# Patient Record
Sex: Female | Born: 1990 | Race: White | Hispanic: No | Marital: Married | Smoking: Never smoker
Health system: Southern US, Community
[De-identification: ages and names within clinical notes are randomized; demographics above are authoritative.]

## PROBLEM LIST (undated history)

## (undated) ENCOUNTER — Inpatient Hospital Stay: Payer: Self-pay

## (undated) DIAGNOSIS — F32A Depression, unspecified: Secondary | ICD-10-CM

## (undated) DIAGNOSIS — R87629 Unspecified abnormal cytological findings in specimens from vagina: Secondary | ICD-10-CM

## (undated) DIAGNOSIS — F329 Major depressive disorder, single episode, unspecified: Secondary | ICD-10-CM

## (undated) DIAGNOSIS — Z62810 Personal history of physical and sexual abuse in childhood: Secondary | ICD-10-CM

## (undated) DIAGNOSIS — Z789 Other specified health status: Secondary | ICD-10-CM

## (undated) HISTORY — DX: Personal history of physical and sexual abuse in childhood: Z62.810

## (undated) HISTORY — DX: Depression, unspecified: F32.A

## (undated) HISTORY — PX: OTHER SURGICAL HISTORY: SHX169

## (undated) HISTORY — DX: Unspecified abnormal cytological findings in specimens from vagina: R87.629

## (undated) HISTORY — PX: NO PAST SURGERIES: SHX2092

---
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
## 2015-03-17 ENCOUNTER — Encounter: Payer: Self-pay | Admitting: Emergency Medicine

## 2015-03-17 ENCOUNTER — Emergency Department
Admission: EM | Admit: 2015-03-17 | Discharge: 2015-03-17 | Disposition: A | Discharge status: Home / Self Care | Payer: Self-pay | Attending: Emergency Medicine | Admitting: Emergency Medicine

## 2015-03-17 ENCOUNTER — Emergency Department: Payer: Self-pay

## 2015-03-17 DIAGNOSIS — Z3A01 Less than 8 weeks gestation of pregnancy: Secondary | ICD-10-CM | POA: Insufficient documentation

## 2015-03-17 DIAGNOSIS — O418X10 Other specified disorders of amniotic fluid and membranes, first trimester, not applicable or unspecified: Secondary | ICD-10-CM | POA: Insufficient documentation

## 2015-03-17 DIAGNOSIS — O209 Hemorrhage in early pregnancy, unspecified: Secondary | ICD-10-CM | POA: Insufficient documentation

## 2015-03-17 DIAGNOSIS — O9989 Other specified diseases and conditions complicating pregnancy, childbirth and the puerperium: Secondary | ICD-10-CM | POA: Insufficient documentation

## 2015-03-17 DIAGNOSIS — R300 Dysuria: Secondary | ICD-10-CM | POA: Insufficient documentation

## 2015-03-17 DIAGNOSIS — O468X1 Other antepartum hemorrhage, first trimester: Secondary | ICD-10-CM

## 2015-03-17 LAB — URINALYSIS COMPLETE WITH MICROSCOPIC (ARMC ONLY)
Bacteria, UA: NONE SEEN
Bilirubin Urine: NEGATIVE
Glucose, UA: NEGATIVE mg/dL
Hgb urine dipstick: NEGATIVE
KETONES UR: NEGATIVE mg/dL
LEUKOCYTES UA: NEGATIVE
Nitrite: NEGATIVE
PH: 7 (ref 5.0–8.0)
Protein, ur: NEGATIVE mg/dL
SPECIFIC GRAVITY, URINE: 1.021 (ref 1.005–1.030)
WBC UA: NONE SEEN WBC/hpf (ref 0–5)

## 2015-03-17 LAB — CBC WITH DIFFERENTIAL/PLATELET
BASOS PCT: 0 %
Basophils Absolute: 0 10*3/uL (ref 0–0.1)
EOS ABS: 0.1 10*3/uL (ref 0–0.7)
EOS PCT: 1 %
HCT: 39.7 % (ref 35.0–47.0)
Hemoglobin: 13.1 g/dL (ref 12.0–16.0)
Lymphocytes Relative: 29 %
Lymphs Abs: 2.2 10*3/uL (ref 1.0–3.6)
MCH: 26.6 pg (ref 26.0–34.0)
MCHC: 33 g/dL (ref 32.0–36.0)
MCV: 80.6 fL (ref 80.0–100.0)
MONO ABS: 0.5 10*3/uL (ref 0.2–0.9)
Monocytes Relative: 7 %
NEUTROS ABS: 4.8 10*3/uL (ref 1.4–6.5)
NEUTROS PCT: 63 %
PLATELETS: 206 10*3/uL (ref 150–440)
RBC: 4.92 MIL/uL (ref 3.80–5.20)
RDW: 13.4 % (ref 11.5–14.5)
WBC: 7.5 10*3/uL (ref 3.6–11.0)

## 2015-03-17 LAB — ABO/RH
ABO/RH(D): O POS
ABO/RH(D): O POS

## 2015-03-17 LAB — COMPREHENSIVE METABOLIC PANEL
ALK PHOS: 76 U/L (ref 38–126)
ALT: 12 U/L — AB (ref 14–54)
AST: 16 U/L (ref 15–41)
Albumin: 4.6 g/dL (ref 3.5–5.0)
Anion gap: 9 (ref 5–15)
BILIRUBIN TOTAL: 0.2 mg/dL — AB (ref 0.3–1.2)
BUN: 10 mg/dL (ref 6–20)
CO2: 25 mmol/L (ref 22–32)
CREATININE: 0.52 mg/dL (ref 0.44–1.00)
Calcium: 9.3 mg/dL (ref 8.9–10.3)
Chloride: 102 mmol/L (ref 101–111)
GFR calc Af Amer: >60 mL/min (ref >60)
GLUCOSE: 90 mg/dL (ref 65–99)
Potassium: 3.8 mmol/L (ref 3.5–5.1)
Sodium: 136 mmol/L (ref 135–145)
Total Protein: 8 g/dL (ref 6.5–8.1)

## 2015-03-17 LAB — HCG, QUANTITATIVE, PREGNANCY: HCG, BETA CHAIN, QUANT, S: 17318 m[IU]/mL — AB (ref <5)

## 2015-03-17 NOTE — ED Notes (Signed)
Lab notified of add on test.   

## 2015-03-17 NOTE — ED Notes (Signed)
Positive pregnancy test 4 days ago

## 2015-03-17 NOTE — Discharge Instructions (Signed)
Pelvic Rest °Pelvic rest is sometimes recommended for women when:  °· The placenta is partially or completely covering the opening of the cervix (placenta previa). °· There is bleeding between the uterine wall and the amniotic sac in the first trimester (subchorionic hemorrhage). °· The cervix begins to open without labor starting (incompetent cervix, cervical insufficiency). °· The labor is too early (preterm labor). °HOME CARE INSTRUCTIONS °· Do not have sexual intercourse, stimulation, or an orgasm. °· Do not use tampons, douche, or put anything in the vagina. °· Do not lift anything over 10 pounds (4.5 kg). °· Avoid strenuous activity or straining your pelvic muscles. °SEEK MEDICAL CARE IF:  °· You have any vaginal bleeding during pregnancy. Treat this as a potential emergency. °· You have cramping pain felt low in the stomach (stronger than menstrual cramps). °· You notice vaginal discharge (watery, mucus, or bloody). °· You have a low, dull backache. °· There are regular contractions or uterine tightening. °SEEK IMMEDIATE MEDICAL CARE IF: °You have vaginal bleeding and have placenta previa.  °Document Released: 02/07/2011 Document Revised: 01/05/2012 Document Reviewed: 02/07/2011 °ExitCare® Patient Information ©2015 [employer], LLC. This information is not intended to replace advice given to you by your health care provider. Make sure you discuss any questions you have with your health care provider. ° °Vaginal Bleeding During Pregnancy, First Trimester °A small amount of bleeding (spotting) from the vagina is common in early pregnancy. Sometimes the bleeding is normal and is not a problem, and sometimes it is a sign of something serious. Be sure to tell your doctor about any bleeding from your vagina right away. °HOME CARE °· Watch your condition for any changes. °· Follow your doctor's instructions about how active you can be. °· If you are on bed rest: °¨ You may need to stay in bed and only get up to use the  bathroom. °¨ You may be allowed to do some activities. °¨ If you need help, make plans for someone to help you. °· Write down: °¨ The number of pads you use each day. °¨ How often you change pads. °¨ How soaked (saturated) your pads are. °· Do not use tampons. °· Do not douche. °· Do not have sex or orgasms until your doctor says it is okay. °· If you pass any tissue from your vagina, save the tissue so you can show it to your doctor. °· Only take medicines as told by your doctor. °· Do not take aspirin because it can make you bleed. °· Keep all follow-up visits as told by your doctor. °GET HELP IF:  °· You bleed from your vagina. °· You have cramps. °· You have labor pains. °· You have a fever that does not go away after you take medicine. °GET HELP RIGHT AWAY IF:  °· You have very bad cramps in your back or belly (abdomen). °· You pass large clots or tissue from your vagina. °· You bleed more. °· You feel light-headed or weak. °· You pass out (faint). °· You have chills. °· You are leaking fluid or have a gush of fluid from your vagina. °· You pass out while pooping (having a bowel movement). °MAKE SURE YOU: °· Understand these instructions. °· Will watch your condition. °· Will get help right away if you are not doing well or get worse. °Document Released: 02/27/2014 Document Reviewed: 06/20/2013 °ExitCare® Patient Information ©2015 [employer]. This information is not intended to replace advice given to you by your health care provider. Make   sure you discuss any questions you have with your health care provider. ° °

## 2015-03-17 NOTE — ED Notes (Signed)
Patient with no complaints at this time. Respirations even and unlabored. Skin warm/dry. Discharge instructions reviewed with patient at this time. Patient given opportunity to voice concerns/ask questions. Patient discharged at this time and left Emergency Department with steady gait.   

## 2015-03-17 NOTE — ED Provider Notes (Signed)
[facility] Emergency Department Provider Note  ____________________________________________  Time seen: Approximately 1831  I have reviewed the triage vital signs and the nursing notes.   HISTORY  Chief Complaint Vaginal Bleeding    HPI Charlene Smith is a 24 y.o. female presents to the emergency department today for vaginal bleeding. She discovered she is pregnant by home pregnancy test 4 days ago. She states that she has used one super tampon since the bleeding started and is using second one now. She describes bright red blood. She denies abdominal pain or cramping.   History reviewed. No pertinent past medical history.  There are no active problems to display for this patient.   No past surgical history on file.  No current outpatient prescriptions on file.  Allergies Review of patient's allergies indicates not on file.  No family history on file.  Social History History  Substance Use Topics  . Smoking status: Never Smoker   . Smokeless tobacco: Not on file  . Alcohol Use: No    Review of Systems Constitutional: No fever/chills Cardiovascular: Denies chest pain. Respiratory: Denies shortness of breath or cough. Gastrointestinal: Abdominal pain no., nausea no, vomitingno. Genitourinary: Dysuria no, vaginal discharge, no. Positive for vaginal bleeding. Denies passing clots or tissue. G1 P0 A0. Musculoskeletal: Negative for back pain. Skin: Negative for rash. Neurological: Negative for headaches, focal weakness or numbness.  10-point ROS otherwise negative.  ____________________________________________   PHYSICAL EXAM:  VITAL SIGNS: ED Triage Vitals  Enc Vitals Group     BP 03/17/15 1459 134/75 mmHg     Pulse Rate 03/17/15 1459 99     Resp 03/17/15 1459 20     Temp 03/17/15 1459 97.9 F (36.6 C)     Temp Source 03/17/15 1459 Oral     SpO2 03/17/15 1459 100 %     Weight 03/17/15 1459 190 lb (86.183 kg)     Height  03/17/15 1459 5\' 8"  (1.727 m)     Head Cir --      Peak Flow --      Pain Score 03/17/15 1500 5     Pain Loc --      Pain Edu? --      Excl. in GC? --     Constitutional: Alert and oriented. Well appearing and in no acute distress. Eyes: Conjunctivae are normal. PERRL. EOMI. Head: Atraumatic. Nose: No congestion/rhinnorhea. Mouth/Throat: Mucous membranes are moist.  Oropharynx non-erythematous. Neck: No stridor. Cardiovascular: Good peripheral circulation. Respiratory: Normal respiratory effort.  No retractions. Gastrointestinal: Soft and nontender. No distention. No abdominal bruits. Genitourinary: Pelvic exam: Deferred-upon initial exam transvaginal ultrasound results were available to show IUP.  Musculoskeletal: No extremity tenderness nor edema.  Neurologic:  Normal speech and language. No gross focal neurologic deficits are appreciated. Speech is normal. No gait instability. Skin:  Skin is warm, dry and intact. No rash noted. Psychiatric: Mood and affect are normal. Speech and behavior are normal.  ____________________________________________   LABS (all labs ordered are listed, but only abnormal results are displayed)  Labs Reviewed  COMPREHENSIVE METABOLIC PANEL - Abnormal; Notable for the following:    ALT 12 (*)    Total Bilirubin 0.2 (*)    All other components within normal limits  HCG, QUANTITATIVE, PREGNANCY - Abnormal; Notable for the following:    hCG, Beta Chain, Quant, S 17318 (*)    All other components within normal limits  URINALYSIS COMPLETEWITH MICROSCOPIC ([facility])  - Abnormal; Notable for the following:  Color, Urine YELLOW (*)    APPearance CLOUDY (*)    Squamous Epithelial / LPF 0-5 (*)    All other components within normal limits  CBC WITH DIFFERENTIAL/PLATELET  ABO/RH  ABO/RH  O Positive ____________________________________________  RADIOLOGY  Single live IUP estimated gestation of 5 weeks 5 days. Large amount of subchorionic hemorrhage  noted ____________________________________________   PROCEDURES  Procedure(s) performed: None  ____________________________________________   INITIAL IMPRESSION / ASSESSMENT AND PLAN / ED COURSE  Pertinent labs & imaging results that were available during my care of the patient were reviewed by me and considered in my medical decision making (see chart for details).  Pelvic rest was discussed with the patient. She was advised not to use tampons. She was advised to follow up with the OB/GYN of her choice. She was also given strict return precautions. Patient will be discharged home. ____________________________________________   FINAL CLINICAL IMPRESSION(S) / ED DIAGNOSES  Final diagnoses:  Vaginal bleeding in pregnancy, first trimester  Subchorionic bleed, first trimester      Chinita Pester, FNP 03/17/15 2019  Chinita Pester, FNP 03/17/15 2020

## 2015-04-02 NOTE — ED Provider Notes (Signed)
Cosign, attestation for visit of 03/17/2015:  I have reviewed the mid-level's documentation on 03/24/2015 and was available to the mid-level if needed during evaluation of this patient on 03/17/2015.  Darien Ramusavid W Jaleal Schliep, MD 04/02/15 [phone]

## 2015-05-05 ENCOUNTER — Encounter: Payer: Self-pay | Admitting: *Deleted

## 2015-05-05 ENCOUNTER — Emergency Department
Admission: EM | Admit: 2015-05-05 | Discharge: 2015-05-06 | Disposition: A | Discharge status: Home / Self Care | Payer: Medicaid Other | Attending: Emergency Medicine | Admitting: Emergency Medicine

## 2015-05-05 DIAGNOSIS — O200 Threatened abortion: Secondary | ICD-10-CM | POA: Insufficient documentation

## 2015-05-05 DIAGNOSIS — Z3A12 12 weeks gestation of pregnancy: Secondary | ICD-10-CM | POA: Insufficient documentation

## 2015-05-05 DIAGNOSIS — O9989 Other specified diseases and conditions complicating pregnancy, childbirth and the puerperium: Secondary | ICD-10-CM | POA: Diagnosis present

## 2015-05-05 DIAGNOSIS — Z79899 Other long term (current) drug therapy: Secondary | ICD-10-CM | POA: Insufficient documentation

## 2015-05-05 DIAGNOSIS — N760 Acute vaginitis: Secondary | ICD-10-CM

## 2015-05-05 DIAGNOSIS — B9689 Other specified bacterial agents as the cause of diseases classified elsewhere: Secondary | ICD-10-CM

## 2015-05-05 DIAGNOSIS — O23591 Infection of other part of genital tract in pregnancy, first trimester: Secondary | ICD-10-CM | POA: Diagnosis not present

## 2015-05-05 LAB — CBC WITH DIFFERENTIAL/PLATELET
BASOS ABS: 0 10*3/uL (ref 0–0.1)
Basophils Relative: 0 %
EOS ABS: 0.1 10*3/uL (ref 0–0.7)
Eosinophils Relative: 1 %
HCT: 38 % (ref 35.0–47.0)
Hemoglobin: 12.7 g/dL (ref 12.0–16.0)
Lymphocytes Relative: 26 %
Lymphs Abs: 2.4 10*3/uL (ref 1.0–3.6)
MCH: 27.1 pg (ref 26.0–34.0)
MCHC: 33.5 g/dL (ref 32.0–36.0)
MCV: 81 fL (ref 80.0–100.0)
Monocytes Absolute: 0.5 10*3/uL (ref 0.2–0.9)
Monocytes Relative: 6 %
NEUTROS PCT: 67 %
Neutro Abs: 6.3 10*3/uL (ref 1.4–6.5)
PLATELETS: 163 10*3/uL (ref 150–440)
RBC: 4.69 MIL/uL (ref 3.80–5.20)
RDW: 14.1 % (ref 11.5–14.5)
WBC: 9.3 10*3/uL (ref 3.6–11.0)

## 2015-05-05 LAB — URINALYSIS COMPLETE WITH MICROSCOPIC (ARMC ONLY)
Bacteria, UA: NONE SEEN
Bilirubin Urine: NEGATIVE
Glucose, UA: NEGATIVE mg/dL
HGB URINE DIPSTICK: NEGATIVE
Ketones, ur: NEGATIVE mg/dL
LEUKOCYTES UA: NEGATIVE
Nitrite: NEGATIVE
PROTEIN: NEGATIVE mg/dL
SPECIFIC GRAVITY, URINE: 1.023 (ref 1.005–1.030)
pH: 6 (ref 5.0–8.0)

## 2015-05-05 LAB — BASIC METABOLIC PANEL
Anion gap: 6 (ref 5–15)
BUN: 9 mg/dL (ref 6–20)
CO2: 23 mmol/L (ref 22–32)
Calcium: 8.9 mg/dL (ref 8.9–10.3)
Chloride: 107 mmol/L (ref 101–111)
Creatinine, Ser: 0.37 mg/dL — ABNORMAL LOW (ref 0.44–1.00)
GFR calc Af Amer: >60 mL/min (ref >60)
GFR calc non Af Amer: >60 mL/min (ref >60)
GLUCOSE: 86 mg/dL (ref 65–99)
Potassium: 3.6 mmol/L (ref 3.5–5.1)
Sodium: 136 mmol/L (ref 135–145)

## 2015-05-05 LAB — HCG, QUANTITATIVE, PREGNANCY: HCG, BETA CHAIN, QUANT, S: 98854 m[IU]/mL — AB (ref <5)

## 2015-05-05 NOTE — ED Notes (Signed)
Pt c/o bilateral lower pelvic pain starting 2-3 hrs ago. Pt states she is [redacted]wks pregnant. Pt denies n/v/d and dysuria. Pt c/o vaginal discharge and spotting.

## 2015-05-05 NOTE — ED Notes (Signed)
Pt reports pelvic pain x 3 hours.  Pt reports [redacted] weeks pregnant, first pregnancy.  Pt reports normal vaginal discharge and scant bleeding.  Pt denies urinary sx.  Pt reports n/v but states morning sickness.  Pt NAD at this time.

## 2015-05-06 ENCOUNTER — Emergency Department: Payer: Medicaid Other

## 2015-05-06 LAB — CHLAMYDIA/NGC RT PCR (ARMC ONLY)
CHLAMYDIA TR: NOT DETECTED
N GONORRHOEAE: NOT DETECTED

## 2015-05-06 LAB — WET PREP, GENITAL
Trich, Wet Prep: NONE SEEN
Yeast Wet Prep HPF POC: NONE SEEN

## 2015-05-06 MED ORDER — METRONIDAZOLE 500 MG PO TABS: 500.0000 mg | ORAL_TABLET | Freq: Two times a day (BID) | ORAL | Status: AC

## 2015-05-06 MED ORDER — METRONIDAZOLE 500 MG PO TABS
ORAL_TABLET | ORAL | Status: AC
  Administered 2015-05-06: 500 mg via ORAL
  Filled 2015-05-06: qty 1

## 2015-05-06 MED ORDER — METRONIDAZOLE 500 MG PO TABS
500.0000 mg | ORAL_TABLET | Freq: Once | ORAL | Status: AC
  Administered 2015-05-06: 500 mg via ORAL

## 2015-05-06 NOTE — ED Notes (Signed)
Pt taken to US

## 2015-05-06 NOTE — ED Provider Notes (Signed)
[facility] Emergency Department Provider Note  ____________________________________________  Time seen: Approximately 2331 PM  I have reviewed the triage vital signs and the nursing notes.   HISTORY  Chief Complaint Pelvic Pain    HPI Charlene Smith is a 24 y.o. female who is [redacted] weeks pregnant. She reports that she started having some abdominal cramping and spotting that started around 6:54 PM. The patient reports that she was trying to flip a mattress when this started. The patient is a G1 P0 and has not been to the doctor for this pregnancy. The patient reports that her last menstrual period was in March or April. She was concerned about the symptoms that she decided to come in for further evaluation. She reports that she does have some vaginal discharge as well and that the spotting has improved. She also reports that the pain is improved and is approximately a 4 out of 10 in intensity.   History reviewed. No pertinent past medical history.  There are no active problems to display for this patient.   History reviewed. No pertinent past surgical history.  Current Outpatient Rx  Name  Route  Sig  Dispense  Refill  . Prenatal Vit-Fe Fumarate-FA (PRENATAL VITAMIN PO)   Oral   Take 2 tablets by mouth every morning.         . metroNIDAZOLE (FLAGYL) 500 MG tablet   Oral   Take 1 tablet (500 mg total) by mouth 2 (two) times daily.   14 tablet   0     Allergies Review of patient's allergies indicates no known allergies.  History reviewed. No pertinent family history.  Social History History  Substance Use Topics  . Smoking status: Never Smoker   . Smokeless tobacco: Never Used  . Alcohol Use: No    Review of Systems Constitutional: No fever/chills Eyes: No visual changes. ENT: No sore throat. Cardiovascular: Denies chest pain. Respiratory: Denies shortness of breath. Gastrointestinal:  abdominal pain.  nausea and vomiting.    Genitourinary: Negative for dysuria. Musculoskeletal: Negative for back pain. Skin: Negative for rash. Neurological: Negative for headaches, focal weakness or numbness.  10-point ROS otherwise negative.  ____________________________________________   PHYSICAL EXAM:  VITAL SIGNS: ED Triage Vitals  Enc Vitals Group     BP 05/05/15 2139 148/81 mmHg     Pulse Rate 05/05/15 2139 93     Resp 05/05/15 2244 16     Temp 05/05/15 2139 98.6 F (37 C)     Temp Source 05/05/15 2139 Oral     SpO2 05/05/15 2139 99 %     Weight 05/05/15 2139 200 lb (90.719 kg)     Height 05/05/15 2139  (1.702 m)     Head Cir --      Peak Flow --      Pain Score 05/05/15 2139 6     Pain Loc --      Pain Edu? --      Excl. in GC? --     Constitutional: Alert and oriented. Well appearing and in no acute distress. Eyes: Conjunctivae are normal. PERRL. EOMI. Head: Atraumatic. Nose: No congestion/rhinnorhea. Mouth/Throat: Mucous membranes are moist.  Oropharynx non-erythematous. Cardiovascular: Normal rate, regular rhythm. Grossly normal heart sounds.  Good peripheral circulation. Respiratory: Normal respiratory effort.  No retractions. Lungs CTAB. Gastrointestinal: Soft and nontender. No distention. Positive bowel sounds Genitourinary: Normal external genitalia no cervical motion tenderness mild bilateral adnexal tenderness to palpation the patient does have some mild discharge  with no bleeding as well cervical os is closed. Musculoskeletal: No lower extremity tenderness nor edema.  No joint effusions. Neurologic:  Normal speech and language. No gross focal neurologic deficits are appreciated.  Skin:  Skin is warm, dry and intact. No rash noted. Psychiatric: Mood and affect are normal.   ____________________________________________   LABS (all labs ordered are listed, but only abnormal results are displayed)  Labs Reviewed  WET PREP, GENITAL - Abnormal; Notable for the following:    Clue Cells  Wet Prep HPF POC MODERATE (*)    WBC, Wet Prep HPF POC MODERATE (*)    All other components within normal limits  URINALYSIS COMPLETEWITH MICROSCOPIC ([facility] ONLY) - Abnormal; Notable for the following:    Color, Urine YELLOW (*)    APPearance CLEAR (*)    Squamous Epithelial / LPF 0-5 (*)    All other components within normal limits  BASIC METABOLIC PANEL - Abnormal; Notable for the following:    Creatinine, Ser 0.37 (*)    All other components within normal limits  HCG, QUANTITATIVE, PREGNANCY - Abnormal; Notable for the following:    hCG, Beta Chain, Quant, S [id number] (*)    All other components within normal limits  CHLAMYDIA/NGC RT PCR ([facility] ONLY)  CBC WITH DIFFERENTIAL/PLATELET   ____________________________________________  EKG  None ____________________________________________  RADIOLOGY  Pelvic ultrasound: Single live intrauterine pregnancy, gestational sac by ultrasound 12 weeks and 4 days, small residual subchorionic hemorrhage ____________________________________________   PROCEDURES  Procedure(s) performed: None  Critical Care performed: No  ____________________________________________   INITIAL IMPRESSION / ASSESSMENT AND PLAN / ED COURSE  Pertinent labs & imaging results that were available during my care of the patient were reviewed by me and considered in my medical decision making (see chart for details).  This is a 24 year old female who comes in with some vaginal bleeding and abdominal cramping who is also [redacted] weeks pregnant. The patient does have some moderate clue cells on her wet prep which is concerning for bacterial vaginosis but we will await the results of the patient's pelvic ultrasound to determine if the patient is having a possible miscarriage.  ----------------------------------------- 2:29 AM on 05/06/2015 -----------------------------------------  Patient does have bacterial vaginosis and will receive metronidazole for those  symptoms. The patient's ultrasound does show cardiac activity but I did discuss with the patient that any bleeding in pregnancy is considered a threatened miscarriage. The patient be discharged home to follow up and obtain care with OB/GYN. Patient agrees with the plan. ____________________________________________   FINAL CLINICAL IMPRESSION(S) / ED DIAGNOSES  Final diagnoses:  Bacterial vaginosis  Threatened abortion      Rebecka ApleyAllison P Webster, MD 05/06/15 [phone]

## 2015-05-06 NOTE — Discharge Instructions (Signed)
Bacterial Vaginosis Bacterial vaginosis is a vaginal infection that occurs when the normal balance of bacteria in the vagina is disrupted. It results from an overgrowth of certain bacteria. This is the most common vaginal infection in women of childbearing age. Treatment is important to prevent complications, especially in pregnant women, as it can cause a premature delivery. CAUSES  Bacterial vaginosis is caused by an increase in harmful bacteria that are normally present in smaller amounts in the vagina. Several different kinds of bacteria can cause bacterial vaginosis. However, the reason that the condition develops is not fully understood. RISK FACTORS Certain activities or behaviors can put you at an increased risk of developing bacterial vaginosis, including:  Having a new sex partner or multiple sex partners.  Douching.  Using an intrauterine device (IUD) for contraception. Women do not get bacterial vaginosis from toilet seats, bedding, swimming pools, or contact with objects around them. SIGNS AND SYMPTOMS  Some women with bacterial vaginosis have no signs or symptoms. Common symptoms include:  Grey vaginal discharge.  A fishlike odor with discharge, especially after sexual intercourse.  Itching or burning of the vagina and vulva.  Burning or pain with urination. DIAGNOSIS  Your health care provider will take a medical history and examine the vagina for signs of bacterial vaginosis. A sample of vaginal fluid may be taken. Your health care provider will look at this sample under a microscope to check for bacteria and abnormal cells. A vaginal pH test may also be done.  TREATMENT  Bacterial vaginosis may be treated with antibiotic medicines. These may be given in the form of a pill or a vaginal cream. A second round of antibiotics may be prescribed if the condition comes back after treatment.  HOME CARE INSTRUCTIONS   Only take over-the-counter or prescription medicines as  directed by your health care provider.  If antibiotic medicine was prescribed, take it as directed. Make sure you finish it even if you start to feel better.  Do not have sex until treatment is completed.  Tell all sexual partners that you have a vaginal infection. They should see their health care provider and be treated if they have problems, such as a mild rash or itching.  Practice safe sex by using condoms and only having one sex partner. SEEK MEDICAL CARE IF:   Your symptoms are not improving after 3 days of treatment.  You have increased discharge or pain.  You have a fever. MAKE SURE YOU:   Understand these instructions.  Will watch your condition.  Will get help right away if you are not doing well or get worse. FOR MORE INFORMATION  Centers for Disease Control and Prevention, Division of STD Prevention: SolutionApps.co.zawww.cdc.gov/std American Sexual Health Association (ASHA): www.ashastd.org  Document Released: 10/13/2005 Document Revised: 08/03/2013 Document Reviewed: 05/25/2013 [facility] Patient Information 2015 [place], [place]. This information is not intended to replace advice given to you by your health care provider. Make sure you discuss any questions you have with your health care provider.  Threatened Miscarriage A threatened miscarriage occurs when you have vaginal bleeding during your first 20 weeks of pregnancy but the pregnancy has not ended. If you have vaginal bleeding during this time, your health care provider will do tests to make sure you are still pregnant. If the tests show you are still pregnant and the developing baby (fetus) inside your womb (uterus) is still growing, your condition is considered a threatened miscarriage. A threatened miscarriage does not mean your pregnancy will end, but  it does increase the risk of losing your pregnancy (complete miscarriage). CAUSES  The cause of a threatened miscarriage is usually not known. If you go on to have a complete  miscarriage, the most common cause is an abnormal number of chromosomes in the developing baby. Chromosomes are the structures inside cells that hold all your genetic material. Some causes of vaginal bleeding that do not result in miscarriage include:  Having sex.  Having an infection.  Normal hormone changes of pregnancy.  Bleeding that occurs when an egg implants in your uterus. RISK FACTORS Risk factors for bleeding in early pregnancy include:  Obesity.  Smoking.  Drinking excessive amounts of alcohol or caffeine.  Recreational drug use. SIGNS AND SYMPTOMS  Light vaginal bleeding.  Mild abdominal pain or cramps. DIAGNOSIS  If you have bleeding with or without abdominal pain before 20 weeks of pregnancy, your health care provider will do tests to check whether you are still pregnant. One important test involves using sound waves and a computer (ultrasound) to create images of the inside of your uterus. Other tests include an internal exam of your vagina and uterus (pelvic exam) and measurement of your baby's heart rate.  You may be diagnosed with a threatened miscarriage if:  Ultrasound testing shows you are still pregnant.  Your baby's heart rate is strong.  A pelvic exam shows that the opening between your uterus and your vagina (cervix) is closed.  Your heart rate and blood pressure are stable.  Blood tests confirm you are still pregnant. TREATMENT  No treatments have been shown to prevent a threatened miscarriage from going on to a complete miscarriage. However, the right home care is important.  HOME CARE INSTRUCTIONS   Make sure you keep all your appointments for prenatal care. This is very important.  Get plenty of rest.  Do not have sex or use tampons if you have vaginal bleeding.  Do not douche.  Do not smoke or use recreational drugs.  Do not drink alcohol.  Avoid caffeine. SEEK MEDICAL CARE IF:  You have light vaginal bleeding or spotting while  pregnant.  You have abdominal pain or cramping.  You have a fever. SEEK IMMEDIATE MEDICAL CARE IF:  You have heavy vaginal bleeding.  You have blood clots coming from your vagina.  You have severe low back pain or abdominal cramps.  You have fever, chills, and severe abdominal pain. MAKE SURE YOU:  Understand these instructions.  Will watch your condition.  Will get help right away if you are not doing well or get worse. Document Released: 10/13/2005 Document Revised: 10/18/2013 Document Reviewed: 08/09/2013 [facility] Patient Information 2015 [place], [place]. This information is not intended to replace advice given to you by your health care provider. Make sure you discuss any questions you have with your health care provider.

## 2015-05-30 DIAGNOSIS — Z62810 Personal history of physical and sexual abuse in childhood: Secondary | ICD-10-CM

## 2015-05-30 DIAGNOSIS — Z6828 Body mass index (BMI) 28.0-28.9, adult: Secondary | ICD-10-CM | POA: Insufficient documentation

## 2015-05-30 HISTORY — DX: Personal history of physical and sexual abuse in childhood: Z62.810

## 2015-05-30 LAB — HM HIV SCREENING LAB: HM HIV Screening: NEGATIVE

## 2015-05-30 LAB — HM PAP SMEAR

## 2015-08-07 ENCOUNTER — Observation Stay
Admission: EM | Admit: 2015-08-07 | Discharge: 2015-08-07 | Disposition: A | Discharge status: Home / Self Care | Payer: Medicaid Other | Attending: Obstetrics and Gynecology | Admitting: Obstetrics and Gynecology

## 2015-08-07 ENCOUNTER — Encounter: Payer: Self-pay | Admitting: *Deleted

## 2015-08-07 DIAGNOSIS — Z3A00 Weeks of gestation of pregnancy not specified: Secondary | ICD-10-CM | POA: Diagnosis not present

## 2015-08-07 DIAGNOSIS — R109 Unspecified abdominal pain: Secondary | ICD-10-CM | POA: Insufficient documentation

## 2015-08-07 DIAGNOSIS — O26899 Other specified pregnancy related conditions, unspecified trimester: Principal | ICD-10-CM | POA: Insufficient documentation

## 2015-08-07 HISTORY — DX: Other specified health status: Z78.9

## 2015-08-07 LAB — URINALYSIS COMPLETE WITH MICROSCOPIC (ARMC ONLY)
BILIRUBIN URINE: NEGATIVE
Glucose, UA: NEGATIVE mg/dL
HGB URINE DIPSTICK: NEGATIVE
Ketones, ur: NEGATIVE mg/dL
LEUKOCYTES UA: NEGATIVE
Nitrite: NEGATIVE
Protein, ur: NEGATIVE mg/dL
SPECIFIC GRAVITY, URINE: 1.018 (ref 1.005–1.030)
pH: 7 (ref 5.0–8.0)

## 2015-08-07 MED ORDER — ACETAMINOPHEN 325 MG PO TABS
650.0000 mg | ORAL_TABLET | Freq: Four times a day (QID) | ORAL | Status: DC | PRN
  Administered 2015-08-07: 650 mg via ORAL

## 2015-08-07 MED ORDER — ACETAMINOPHEN 325 MG PO TABS
ORAL_TABLET | ORAL | Status: AC
  Administered 2015-08-07: 650 mg via ORAL
  Filled 2015-08-07: qty 2

## 2015-08-07 NOTE — OB Triage Note (Signed)
Constant throbbing abdominal pain since 08/06/2015 PM. No leaking fluid or vaginal bleeding. No reported fetal movement for 2 days. Denies urinary symptoms.

## 2015-08-07 NOTE — Discharge Summary (Signed)
Reviewed discharge instructions with patient and significant other, both verbalized understanding, including signs and symptoms of preeclampsia, decreased fetal movement and vaginal bleeding. Copy of instructions given to patient. Ambulatory and stable upon discharge.

## 2015-09-06 ENCOUNTER — Encounter: Payer: Self-pay | Admitting: Obstetrics and Gynecology

## 2015-10-28 NOTE — L&D Delivery Note (Addendum)
Obstetrical Delivery Note   Date of Delivery:   11/23/2015 Primary OB:   [facility] Gestational Age/EDD: [redacted]w[redacted]d (Dated by LMP=5wk ultrasound) Antepartum complications: Scant prenatal care, BMI 33, GBS positive, gestational thrombocytopenia diagnosed on admission  Delivered By:   Cornelia Copa MD  Delivery Type:   forceps, low  Delivery Details:   Went to see patient for variable, late decelerations and patient noted to be complete and +2.  Foley catheter removed and FSE applied, and she continued to have decelerations, despite normal resuscitative measures, so mutual decision for operative delivery made. Peds notified, due to need for OVD and prior AROM with mild meconium. Patient checked and noted to be +3, DOA with pelvis feeling adequate for prior EFW. Epidural bolused and Luikart-Elliott forceps applied. Over next UC, patient delivered infant, with forceps removed prior to head delivery. Head delivery via modified Ritgen and loose nuchal x 1 reduced. Rest of the infant was delivered w/o issue. Perineum and left labia minora repaired with 3 and 2-0 vicryl in the usual fashion. Cervix inspected and normal.  Rectal exam negative. Anesthesia:    epidural Intrapartum complications: mild meconium, late and variable decelerations just prior to delivery GBS:    Positive and she received at least two doses of antibiotics Laceration:    1st degree (repaired) Episiotomy:    none Placenta:    Delivered and expressed via active management. Intact: yes. To pathology: no.  Estimated Blood Loss:   Baby:    Liveborn female, APGARs 8/9, weight 3540gm  Cornelia Copa. MD [employer] Pager [phone]

## 2015-11-12 ENCOUNTER — Observation Stay
Admission: EM | Admit: 2015-11-12 | Discharge: 2015-11-13 | Disposition: A | Discharge status: Home / Self Care | Payer: Medicaid Other | Attending: Certified Nurse Midwife | Admitting: Certified Nurse Midwife

## 2015-11-12 DIAGNOSIS — Z3A39 39 weeks gestation of pregnancy: Secondary | ICD-10-CM | POA: Insufficient documentation

## 2015-11-12 DIAGNOSIS — O0933 Supervision of pregnancy with insufficient antenatal care, third trimester: Secondary | ICD-10-CM | POA: Insufficient documentation

## 2015-11-12 DIAGNOSIS — O99820 Streptococcus B carrier state complicating pregnancy: Secondary | ICD-10-CM | POA: Insufficient documentation

## 2015-11-12 NOTE — OB Triage Note (Signed)
Pt presents to L&D with c/o contractions q 45 minutes. Reports good fetal movement and denies LOF or vaginal bleeding. EFM applied and explained. Plan to monitor fetal and maternal well being and assess for labor

## 2015-11-12 NOTE — Discharge Instructions (Signed)
Call provider or return to birthplace with: ° °1. Strong regular contractions every 5 minutes. °2. Leaking of fluid from your vagina °3. Vaginal bleeding: Bright red or heavy like a period °4. Decreased Fetal movement ° °

## 2015-11-12 NOTE — Progress Notes (Addendum)
L&D Final Progress Note 25 year old G1 P0 with EDC=11/15/2015 presents at 39.[redacted] weeks gestation. [facility] and remarkable for insufficient prenatal care, GBS positive.  S: Presented with complaints of contractions every 45 minutes.  O: BP 120/63 mmHg  Pulse 87  Temp(Src) 98.1 F (36.7 C) (Oral)  Resp 18  Ht 5\' 6"  (1.676 m)  Wt 98.884 kg (218 lb)  BMI 35.20 kg/m2  LMP 02/08/2015 (Approximate)   General: in NAD, comfortable FHR: 135 with accelerations to 150s to 160s, moderate variability Toco: no contractions seen over 45 minutes. Cervix: closed/ TH/ posterior per RN exam  A: IUP at 39.4 weeks, not in labor Reactive NST  P: [facility]  Farrel ConnersGUTIERREZ, Acy Orsak, CNM

## 2015-11-13 DIAGNOSIS — O0933 Supervision of pregnancy with insufficient antenatal care, third trimester: Secondary | ICD-10-CM | POA: Diagnosis not present

## 2015-11-13 DIAGNOSIS — Z3A39 39 weeks gestation of pregnancy: Secondary | ICD-10-CM | POA: Diagnosis not present

## 2015-11-13 DIAGNOSIS — O99820 Streptococcus B carrier state complicating pregnancy: Secondary | ICD-10-CM | POA: Diagnosis not present

## 2015-11-15 NOTE — Final Progress Note (Signed)
L&D Final Progress Note  Admission Date: 11/12/2015 Admission Diagnosis: Contractions at 39.4 weeks  Discharge Date: 11/13/2015 Discharge Diagnosis: Contractions: resolved Not in labor   25 year old G1 P0 with EDC=11/15/2015 presents at 39.[redacted] weeks gestation. PNC at [facility] then transferred to [facility] and remarkable for insufficient prenatal care, GBS positive.  S: Presented with complaints of contractions every 45 minutes.  O: BP 120/63 mmHg  Pulse 87  Temp(Src) 98.1 F (36.7 C) (Oral)  Resp 18  Ht  (1.676 m)  Wt 98.884 kg (218 lb)  BMI 35.20 kg/m2  LMP 02/08/2015 (Approximate)   General: in NAD, comfortable FHR: 135 with accelerations to 150s to 160s, moderate variability Toco: no contractions seen over 45 minutes. Cervix: closed/ TH/ posterior per RN exam  A: IUP at 39.4 weeks, not in labor Reactive NST  P: DC home with labor precautions FU at [facility] tomorrow.  Farrel Conners, CNM

## 2015-11-22 ENCOUNTER — Inpatient Hospital Stay
Admission: EM | Admit: 2015-11-22 | Discharge: 2015-11-25 | DRG: 775 | Disposition: A | Discharge status: Home / Self Care | Payer: Medicaid Other | Attending: Obstetrics and Gynecology | Admitting: Obstetrics and Gynecology

## 2015-11-22 ENCOUNTER — Encounter: Payer: Self-pay | Admitting: *Deleted

## 2015-11-22 DIAGNOSIS — O9912 Other diseases of the blood and blood-forming organs and certain disorders involving the immune mechanism complicating childbirth: Secondary | ICD-10-CM | POA: Diagnosis present

## 2015-11-22 DIAGNOSIS — Z79899 Other long term (current) drug therapy: Secondary | ICD-10-CM | POA: Diagnosis not present

## 2015-11-22 DIAGNOSIS — O99824 Streptococcus B carrier state complicating childbirth: Principal | ICD-10-CM | POA: Diagnosis present

## 2015-11-22 DIAGNOSIS — D62 Acute posthemorrhagic anemia: Secondary | ICD-10-CM | POA: Diagnosis present

## 2015-11-22 DIAGNOSIS — Z3A41 41 weeks gestation of pregnancy: Secondary | ICD-10-CM

## 2015-11-22 DIAGNOSIS — O480 Post-term pregnancy: Secondary | ICD-10-CM | POA: Diagnosis present

## 2015-11-22 DIAGNOSIS — O99119 Other diseases of the blood and blood-forming organs and certain disorders involving the immune mechanism complicating pregnancy, unspecified trimester: Secondary | ICD-10-CM

## 2015-11-22 DIAGNOSIS — D696 Thrombocytopenia, unspecified: Secondary | ICD-10-CM | POA: Diagnosis present

## 2015-11-22 LAB — CBC
HCT: 33.7 % — ABNORMAL LOW (ref 35.0–47.0)
Hemoglobin: 11.1 g/dL — ABNORMAL LOW (ref 12.0–16.0)
MCH: 25.1 pg — ABNORMAL LOW (ref 26.0–34.0)
MCHC: 33 g/dL (ref 32.0–36.0)
MCV: 75.9 fL — ABNORMAL LOW (ref 80.0–100.0)
Platelets: 144 K/uL — ABNORMAL LOW (ref 150–440)
RBC: 4.43 MIL/uL (ref 3.80–5.20)
RDW: 14.3 % (ref 11.5–14.5)
WBC: 9.4 K/uL (ref 3.6–11.0)

## 2015-11-22 MED ORDER — TERBUTALINE SULFATE 1 MG/ML IJ SOLN: 0.2500 mg | Freq: Once | INTRAMUSCULAR | Status: DC | PRN

## 2015-11-22 MED ORDER — BUTORPHANOL TARTRATE 1 MG/ML IJ SOLN: 1.0000 mg | INTRAMUSCULAR | Status: DC | PRN

## 2015-11-22 MED ORDER — LACTATED RINGERS IV SOLN
INTRAVENOUS | Status: DC
  Administered 2015-11-23: 08:00:00 via INTRAVENOUS

## 2015-11-22 MED ORDER — ZOLPIDEM TARTRATE 5 MG PO TABS
5.0000 mg | ORAL_TABLET | Freq: Every evening | ORAL | Status: DC | PRN
  Administered 2015-11-22: 5 mg via ORAL
  Filled 2015-11-22: qty 1

## 2015-11-22 MED ORDER — OXYTOCIN BOLUS FROM INFUSION
500.0000 mL | INTRAVENOUS | Status: DC
  Administered 2015-11-23: 500 mL via INTRAVENOUS

## 2015-11-22 MED ORDER — ACETAMINOPHEN 325 MG PO TABS
650.0000 mg | ORAL_TABLET | ORAL | Status: DC | PRN
  Administered 2015-11-23: 650 mg via ORAL
  Filled 2015-11-22: qty 2

## 2015-11-22 MED ORDER — LACTATED RINGERS IV SOLN
500.0000 mL | INTRAVENOUS | Status: DC | PRN
  Administered 2015-11-23: 500 mL via INTRAVENOUS

## 2015-11-22 MED ORDER — OXYTOCIN 40 UNITS IN LACTATED RINGERS INFUSION - SIMPLE MED
2.5000 [IU]/h | INTRAVENOUS | Status: DC
  Filled 2015-11-22: qty 1000

## 2015-11-22 MED ORDER — DINOPROSTONE 10 MG VA INST
10.0000 mg | VAGINAL_INSERT | Freq: Once | VAGINAL | Status: AC
  Administered 2015-11-22: 10 mg via VAGINAL
  Filled 2015-11-22: qty 1

## 2015-11-22 MED ORDER — DEXTROSE 5 % IV SOLN
5.0000 10*6.[IU] | Freq: Once | INTRAVENOUS | Status: AC
  Administered 2015-11-22: 5 10*6.[IU] via INTRAVENOUS
  Filled 2015-11-22: qty 5

## 2015-11-22 MED ORDER — ONDANSETRON HCL 4 MG/2ML IJ SOLN: 4.0000 mg | Freq: Four times a day (QID) | INTRAMUSCULAR | Status: DC | PRN

## 2015-11-22 MED ORDER — PENICILLIN G POTASSIUM 5000000 UNITS IJ SOLR
2.5000 10*6.[IU] | INTRAVENOUS | Status: DC
  Administered 2015-11-23 (×4): 2.5 10*6.[IU] via INTRAVENOUS
  Filled 2015-11-22 (×10): qty 2.5

## 2015-11-22 NOTE — H&P (Signed)
Obstetrics Admission History & Physical   Induction for Post Dates Pregnancy  HPI:  25 y.o. G1P0000 @ [redacted]w[redacted]d (11/15/2015, by Last Menstrual Period). Admitted on 11/22/2015:   Patient Active Problem List   Diagnosis Date Noted  . Post-dates pregnancy 11/22/2015  . Indication for care in labor and delivery, antepartum 11/13/2015  . Labor and delivery indication for care or intervention 08/07/2015    Presents for IO<L due to post dates pregnancy.  Known GBS POS.  No reported complications this pregnancy.  Prenatal care at: at [facility] and at [facility], ([facility]red to [facility] at 36 weeks).  Pt also has late onset to [facility].  PMHx:  Past Medical History  Diagnosis Date  . Medical history non-contributory    PSHx:  Past Surgical History  Procedure Laterality Date  . No past surgeries     Medications:  Prescriptions prior to admission  Medication Sig Dispense Refill Last Dose  . Prenatal Vit-Fe Fumarate-FA (PRENATAL VITAMIN PO) Take 2 tablets by mouth every morning.   Unknown at Unknown time   Allergies: has No Known Allergies. OBHx:  OB History  Gravida Para Term Preterm AB SAB TAB Ectopic Multiple Living  1 0 0 0 0 0  0      # Outcome Date GA Lbr Len/2nd Weight Sex Delivery Anes PTL Lv  1 Current              ZOX:[id number] except as detailed in HPI. Soc Hx: Pregnancy welcomed  Objective:   Filed Vitals:   11/22/15 2050  BP: 134/88  Pulse: 98  Temp: 98.4 F (36.9 C)  Resp: 16   General: Well nourished, well developed female in no acute distress.  Skin: Warm and dry.  Cardiovascular:Regular rate and rhythm. Respiratory: Clear to auscultation bilateral. Normal respiratory effort Abdomen: Obese, NT Neuro/Psych: Normal mood and affect.   Pelvic exam: is limited by body habitus EGBUS: within normal limits Cervix: CL/30/hi Uterus: No contractions observed for 30 minutes.  Adnexa: not evaluated  EFM:FHR: 140 bpm, variability: moderate,  accelerations:  Present,   decelerations:  Absent Toco: None   Perinatal info:  Blood type: O positive Rubella- Immune Varicella -Not immune TDaP tetanus status unknown to the patient RPR NR / HIV Neg/ HBsAg Neg   Assessment & Plan:   25 y.o. G1P0000 @ [redacted]w[redacted]d, Admitted on 11/22/2015: POST DATES IOL    Admit for labor, Antibiotics for GBS prophylaxis, Fetal Wellbeing Reassuring, Epidural when ready and AROM when Appropriate Cervadil, Cytotec, foley bulb, Pitocin, monitoring, CS, and other aspects of IOL discussed w pt.  Plan Cervadil tonight.  GBS ABX.

## 2015-11-23 ENCOUNTER — Inpatient Hospital Stay: Payer: Medicaid Other | Admitting: Certified Registered Nurse Anesthetist

## 2015-11-23 DIAGNOSIS — O99119 Other diseases of the blood and blood-forming organs and certain disorders involving the immune mechanism complicating pregnancy, unspecified trimester: Secondary | ICD-10-CM

## 2015-11-23 DIAGNOSIS — D696 Thrombocytopenia, unspecified: Secondary | ICD-10-CM | POA: Diagnosis present

## 2015-11-23 LAB — CHLAMYDIA/NGC RT PCR (ARMC ONLY)
CHLAMYDIA TR: NOT DETECTED
N GONORRHOEAE: NOT DETECTED

## 2015-11-23 LAB — RAPID HIV SCREEN (HIV 1/2 AB+AG)
HIV 1/2 Antibodies: NONREACTIVE
HIV-1 P24 ANTIGEN - HIV24: NONREACTIVE

## 2015-11-23 LAB — PROTEIN / CREATININE RATIO, URINE
Creatinine, Urine: 44 mg/dL
Protein Creatinine Ratio: 0.2 mg/mg{Cre} — ABNORMAL HIGH (ref 0.00–0.15)
Total Protein, Urine: 9 mg/dL

## 2015-11-23 LAB — COMPREHENSIVE METABOLIC PANEL
ALBUMIN: 3 g/dL — AB (ref 3.5–5.0)
ALT: 20 U/L (ref 14–54)
AST: 21 U/L (ref 15–41)
Alkaline Phosphatase: 202 U/L — ABNORMAL HIGH (ref 38–126)
Anion gap: 10 (ref 5–15)
BUN: 9 mg/dL (ref 6–20)
CHLORIDE: 106 mmol/L (ref 101–111)
CO2: 18 mmol/L — AB (ref 22–32)
CREATININE: 0.47 mg/dL (ref 0.44–1.00)
Calcium: 8.6 mg/dL — ABNORMAL LOW (ref 8.9–10.3)
GFR calc Af Amer: >60 mL/min (ref >60)
GFR calc non Af Amer: >60 mL/min (ref >60)
Glucose, Bld: 111 mg/dL — ABNORMAL HIGH (ref 65–99)
Potassium: 3.7 mmol/L (ref 3.5–5.1)
SODIUM: 134 mmol/L — AB (ref 135–145)
Total Bilirubin: 0.9 mg/dL (ref 0.3–1.2)
Total Protein: 6.7 g/dL (ref 6.5–8.1)

## 2015-11-23 LAB — URINE DRUG SCREEN, QUALITATIVE (ARMC ONLY)
AMPHETAMINES, UR SCREEN: NOT DETECTED
BENZODIAZEPINE, UR SCRN: NOT DETECTED
Barbiturates, Ur Screen: NOT DETECTED
Cannabinoid 50 Ng, Ur ~~LOC~~: NOT DETECTED
Cocaine Metabolite,Ur ~~LOC~~: NOT DETECTED
MDMA (Ecstasy)Ur Screen: NOT DETECTED
METHADONE SCREEN, URINE: NOT DETECTED
Opiate, Ur Screen: NOT DETECTED
Phencyclidine (PCP) Ur S: NOT DETECTED
TRICYCLIC, UR SCREEN: NOT DETECTED

## 2015-11-23 LAB — CBC
HEMATOCRIT: 35.8 % (ref 35.0–47.0)
HEMOGLOBIN: 11.8 g/dL — AB (ref 12.0–16.0)
MCH: 24.9 pg — AB (ref 26.0–34.0)
MCHC: 32.8 g/dL (ref 32.0–36.0)
MCV: 75.7 fL — AB (ref 80.0–100.0)
Platelets: 142 10*3/uL — ABNORMAL LOW (ref 150–440)
RBC: 4.73 MIL/uL (ref 3.80–5.20)
RDW: 14.7 % — ABNORMAL HIGH (ref 11.5–14.5)
WBC: 11.5 10*3/uL — AB (ref 3.6–11.0)

## 2015-11-23 LAB — TYPE AND SCREEN
ABO/RH(D): O POS
Antibody Screen: NEGATIVE

## 2015-11-23 MED ORDER — ONDANSETRON HCL 4 MG PO TABS: 4.0000 mg | ORAL_TABLET | ORAL | Status: DC | PRN

## 2015-11-23 MED ORDER — TERBUTALINE SULFATE 1 MG/ML IJ SOLN: 0.2500 mg | Freq: Once | INTRAMUSCULAR | Status: DC | PRN

## 2015-11-23 MED ORDER — OXYTOCIN 40 UNITS IN LACTATED RINGERS INFUSION - SIMPLE MED
1.0000 m[IU]/min | INTRAVENOUS | Status: DC
  Administered 2015-11-23: 2 m[IU]/min via INTRAVENOUS

## 2015-11-23 MED ORDER — OXYTOCIN 40 UNITS IN LACTATED RINGERS INFUSION - SIMPLE MED
2.5000 [IU]/h | INTRAVENOUS | Status: DC | PRN
  Filled 2015-11-23: qty 1000

## 2015-11-23 MED ORDER — IBUPROFEN 600 MG PO TABS
600.0000 mg | ORAL_TABLET | Freq: Four times a day (QID) | ORAL | Status: DC
  Administered 2015-11-23 – 2015-11-25 (×7): 600 mg via ORAL
  Filled 2015-11-23 (×7): qty 1

## 2015-11-23 MED ORDER — MAGNESIUM HYDROXIDE 400 MG/5ML PO SUSP: 30.0000 mL | ORAL | Status: DC | PRN

## 2015-11-23 MED ORDER — LANOLIN HYDROUS EX OINT: TOPICAL_OINTMENT | CUTANEOUS | Status: DC | PRN

## 2015-11-23 MED ORDER — ACETAMINOPHEN 325 MG PO TABS: 650.0000 mg | ORAL_TABLET | ORAL | Status: DC | PRN

## 2015-11-23 MED ORDER — SIMETHICONE 80 MG PO CHEW: 80.0000 mg | CHEWABLE_TABLET | ORAL | Status: DC | PRN

## 2015-11-23 MED ORDER — WITCH HAZEL-GLYCERIN EX PADS: 1.0000 "application " | MEDICATED_PAD | CUTANEOUS | Status: DC | PRN

## 2015-11-23 MED ORDER — OXYCODONE-ACETAMINOPHEN 5-325 MG PO TABS: 1.0000 | ORAL_TABLET | Freq: Four times a day (QID) | ORAL | Status: DC | PRN

## 2015-11-23 MED ORDER — DIBUCAINE 1 % RE OINT: 1.0000 "application " | TOPICAL_OINTMENT | RECTAL | Status: DC | PRN

## 2015-11-23 MED ORDER — PRENATAL MULTIVITAMIN CH
1.0000 | ORAL_TABLET | Freq: Every day | ORAL | Status: DC
  Administered 2015-11-24 – 2015-11-25 (×2): 1 via ORAL
  Filled 2015-11-23 (×2): qty 1

## 2015-11-23 MED ORDER — BENZOCAINE-MENTHOL 20-0.5 % EX AERO: 1.0000 "application " | INHALATION_SPRAY | CUTANEOUS | Status: DC | PRN

## 2015-11-23 MED ORDER — DOCUSATE SODIUM 100 MG PO CAPS: 100.0000 mg | ORAL_CAPSULE | Freq: Two times a day (BID) | ORAL | Status: DC | PRN

## 2015-11-23 MED ORDER — TETANUS-DIPHTH-ACELL PERTUSSIS 5-2.5-18.5 LF-MCG/0.5 IM SUSP: 0.5000 mL | Freq: Once | INTRAMUSCULAR | Status: DC

## 2015-11-23 MED ORDER — OXYCODONE-ACETAMINOPHEN 5-325 MG PO TABS: 2.0000 | ORAL_TABLET | Freq: Four times a day (QID) | ORAL | Status: DC | PRN

## 2015-11-23 MED ORDER — ONDANSETRON HCL 4 MG/2ML IJ SOLN: 4.0000 mg | INTRAMUSCULAR | Status: DC | PRN

## 2015-11-23 MED ORDER — ZOLPIDEM TARTRATE 5 MG PO TABS: 5.0000 mg | ORAL_TABLET | Freq: Every evening | ORAL | Status: DC | PRN

## 2015-11-23 MED ORDER — FERROUS SULFATE 325 (65 FE) MG PO TABS
325.0000 mg | ORAL_TABLET | Freq: Every day | ORAL | Status: DC
  Administered 2015-11-24 – 2015-11-25 (×2): 325 mg via ORAL
  Filled 2015-11-23 (×2): qty 1

## 2015-11-23 MED ORDER — DIPHENHYDRAMINE HCL 25 MG PO CAPS
25.0000 mg | ORAL_CAPSULE | Freq: Four times a day (QID) | ORAL | Status: DC | PRN
Start: 2015-11-23 — End: 2015-11-25

## 2015-11-23 MED ORDER — LIDOCAINE-EPINEPHRINE 1 %-1:100000 IJ SOLN
INTRAMUSCULAR | Status: DC | PRN
  Administered 2015-11-23: 3 mL via INTRADERMAL

## 2015-11-23 MED ORDER — BUPIVACAINE HCL (PF) 0.25 % IJ SOLN
INTRAMUSCULAR | Status: DC | PRN
  Administered 2015-11-23 (×2): 4 mL via EPIDURAL

## 2015-11-23 MED ORDER — INFLUENZA VAC SPLIT QUAD 0.5 ML IM SUSY
0.5000 mL | PREFILLED_SYRINGE | INTRAMUSCULAR | Status: AC
  Administered 2015-11-24: 0.5 mL via INTRAMUSCULAR
  Filled 2015-11-23: qty 0.5

## 2015-11-23 MED ORDER — FENTANYL CITRATE (PF) 100 MCG/2ML IJ SOLN: 50.0000 ug | INTRAMUSCULAR | Status: DC | PRN

## 2015-11-23 MED ORDER — FENTANYL 2.5 MCG/ML W/ROPIVACAINE 0.2% IN NS 100 ML EPIDURAL INFUSION (ARMC-ANES)
EPIDURAL | Status: AC
  Administered 2015-11-23: 10 mL/h via EPIDURAL
  Filled 2015-11-23: qty 100

## 2015-11-23 NOTE — Progress Notes (Signed)
L&D Note  11/23/2015 - 5:48 PM  24 y.o. G1 [redacted]w[redacted]d (EDC 1/19, LMP=5wk u/s). Pregnancy complicated by scant PNC, GBS pos, BMI 33. Pt diagnosed with gestational thrombocytopenia on admission  Charlene Smith is admitted for PDIOL   Subjective:  Feeling comfortable after epidural placement   Objective:   Filed Vitals:   11/23/15 1435 11/23/15 1535 11/23/15 1635 11/23/15 1735  BP: 101/57 113/60 135/82 136/83  Pulse: 79 79 86 94  Temp:      TempSrc:      Resp:      Height:      Weight:      SpO2:        Current Vital Signs 24h Vital Sign Ranges  T 98 F (36.7 C) Temp  Avg: 98.3 F (36.8 C)  Min: 98 F (36.7 C)  Max: 98.5 F (36.9 C)  BP 136/83 mmHg BP  Min: 101/57  Max: 144/96  HR 94 Pulse  Avg: 87.9  Min: 70  Max: 99  RR 20 Resp  Avg: 19.5  Min: 16  Max: 24  SaO2 100 % 98/RA SpO2  Avg: 100 %  Min: 100 %  Max: 100 %       24 Hour I/O Current Shift I/O  Time Ins Outs   01/27 0701 - 01/27 1900 In: -  Out: 350 [Urine:350]   FHR: 135 baseline, +accels, occasional early decels, mod var Toco: q3-4 Gen: NAD SVE: 7/80/-1-->AROM @ 1730, mild mec. Pelvis feels adequate   Recent Labs Lab 11/22/15 2118 11/23/15 0944  WBC 9.4 11.5*  HGB 11.1* 11.8*  HCT 33.7* 35.8  PLT 144* 142*    Medications Current Facility-Administered Medications  Medication Dose Route Frequency Provider Last Rate Last Dose  . acetaminophen (TYLENOL) tablet 650 mg  650 mg Oral Q4H PRN Nadara Mustard, MD   650 mg at 11/23/15 0424  . butorphanol (STADOL) injection 1 mg  1 mg Intravenous Q1H PRN Nadara Mustard, MD      . fentaNYL (SUBLIMAZE) injection 50 mcg  50 mcg Intravenous Q1H PRN [place] Bing, MD      . lactated ringers infusion 500-1,000 mL  500-1,000 mL Intravenous PRN Nadara Mustard, MD 1,000 mL/hr at 11/23/15 1126 500 mL at 11/23/15 1126  . lactated ringers infusion   Intravenous Continuous Nadara Mustard, MD 125 mL/hr at 11/23/15 0732    . ondansetron (ZOFRAN) injection 4 mg   4 mg Intravenous Q6H PRN Nadara Mustard, MD      . oxytocin (PITOCIN) IV BOLUS FROM BAG  500 mL Intravenous Continuous Nadara Mustard, MD      . oxytocin (PITOCIN) IV infusion 40 units in LR 1000 mL - Premix  2.5 Units/hr Intravenous Continuous Nadara Mustard, MD      . oxytocin (PITOCIN) IV infusion 40 units in LR 1000 mL - Premix  1-40 milli-units/min Intravenous Titrated [place] Bing, MD 9 mL/hr at 11/23/15 1658 6 milli-units/min at 11/23/15 1658  . penicillin G potassium 2.5 Million Units in dextrose 5 % 100 mL IVPB  2.5 Million Units Intravenous Q4H Nadara Mustard, MD   2.5 Million Units at 11/23/15 1657  . terbutaline (BRETHINE) injection 0.25 mg  0.25 mg Subcutaneous Once PRN Nadara Mustard, MD      . terbutaline (BRETHINE) injection 0.25 mg  0.25 mg Subcutaneous Once PRN  Bing, MD      . zolpidem (AMBIEN) tablet 5 mg  5 mg Oral QHS  PRN Nadara Mustard, MD   5 mg at 11/22/15 2329   Facility-Administered Medications Ordered in Other Encounters  Medication Dose Route Frequency Provider Last Rate Last Dose  . bupivacaine (PF) (MARCAINE) 0.25 % injection    Anesthesia Intra-op Ginger Carne, CRNA   4 mL at 11/23/15 1245  . lidocaine-EPINEPHrine (XYLOCAINE W/EPI) 1 %-1:100000 (with pres) injection    Anesthesia Intra-op Ginger Carne, CRNA   3 mL at 11/23/15 1243    Assessment & Plan:  Pt doing well *IUP: category I with accels *IOL: cibtubye out per protocol, currently at 6 -s/p cervidil *Scant PNC: UDS, 1hr glucola negative *Gestational thrombocytopenia: stable on rpt. Normal PC ratio and CMP. *GBS pos: continue PCN. S/p two doses already *Analgesia: epidural working well  [place] Bing, Montez Hageman MD [facility] Pager [phone]

## 2015-11-23 NOTE — Anesthesia Preprocedure Evaluation (Signed)
Anesthesia Evaluation  Patient identified by MRN, date of birth, ID band Patient awake    Reviewed: Allergy & Precautions, H&P , Patient's Chart, lab work & pertinent test results  History of Anesthesia Complications Negative for: history of anesthetic complications  Airway Mallampati: II       Dental no notable dental hx.    Pulmonary neg pulmonary ROS,    breath sounds clear to auscultation       Cardiovascular negative cardio ROS   Rhythm:regular Rate:Normal     Neuro/Psych    GI/Hepatic GERD  ,  Endo/Other  negative endocrine ROS  Renal/GU negative Renal ROS     Musculoskeletal   Abdominal   Peds  Hematology  (+) Blood dyscrasia, ,   Anesthesia Other Findings   Reproductive/Obstetrics (+) Pregnancy                             Anesthesia Physical Anesthesia Plan  ASA: II  Anesthesia Plan: Epidural   Post-op Pain Management:    Induction:   Airway Management Planned:   Additional Equipment:   Intra-op Plan:   Post-operative Plan:   Informed Consent: I have reviewed the patients History and Physical, chart, labs and discussed the procedure including the risks, benefits and alternatives for the proposed anesthesia with the patient or authorized representative who has indicated his/her understanding and acceptance.     Plan Discussed with: Anesthesiologist  Anesthesia Plan Comments:         Anesthesia Quick Evaluation

## 2015-11-23 NOTE — Discharge Summary (Signed)
Obstetrical Discharge Summary  Date of Admission: 11/22/2015 Date of Discharge: 11/25/2015  Primary OB: [facility]  Gestational Age at Delivery: [redacted]w[redacted]d   Antepartum complications: Scant prenatal care, BMI 33, GBS positive Reason for Admission: Post dates IOL Date of Delivery: 11/25/2015  Delivered By: Cornelia Copa MD Delivery Type: forceps, low Intrapartum complications/course: Patient diagnosed with gestational thrombocytopenia on admission and some mild range BPs and had negative pre-eclampsia work up. Admission UDS negative. Patient had cervidil and then pitocin and AROM. She had large variables and decels once she progressed to complete. Given the decelerations, mutual decision made for forceps delivery Anesthesia: epidural Placenta: Delivered and expressed via active management. Intact: yes. To pathology: no.  Laceration: 1st degree - repaired Episiotomy: none Baby: Liveborn female, APGARs 8/9, weight 3540 g.   Discharge Diagnosis: Delivered.   Postpartum course: Routine postpartum course.  On postpartum day #2, patient ambulating, tolerating PO, lochia is appropriate, voiding spontaneously, and pain well-controlled on PO ibuprofen. Discharge Vital Signs:  Current Vital Signs 24h Vital Sign Ranges  T 98.2 F (36.8 C) Temp  Avg: 98.1 F (36.7 C)  Min: 97.6 F (36.4 C)  Max: 98.3 F (36.8 C)  BP 123/79 mmHg BP  Min: 123/79  Max: 143/64  HR (!) 103 Pulse  Avg: 96  Min: 88  Max: 103  RR 18 Resp  Avg: 17  Min: 16  Max: 18  SaO2 99 % Not Delivered SpO2  Avg: 99.8 %  Min: 99 %  Max: 100 %       24 Hour I/O Current Shift I/O  Time Ins Outs       Discharge Exam:  NAD Abdomen: firm fundus below the umbilicus.  RRR no MRGs CTAB Ext: no c/c/e  Recent Labs Lab 11/22/15 2118 11/23/15 0944 11/24/15 0623  WBC 9.4 11.5*  --   HGB 11.1* 11.8*  --   HCT 33.7* 35.8 31.5*  PLT 144* 142*  --     Disposition: Home  Rh Immune globulin given: not applicable Rubella vaccine  given: not applicable Tdap vaccine given in AP or PP setting: yes Flu vaccine given in AP or PP setting: yes PP Varicella dose #1 given prior to discharge.  Contraception: progesterone-only OCPs  Prenatal/Postnatal Panel: O POS//Rubella Immune//Varicella NON Immune//RPR NR//HIV negative/HepB Surface Ag negative//pap unknown//plans to breastfeed  Plan:  Charlene Smith was discharged to home in good condition. Follow-up appointment with Dr. Vergie Living in 4 weeks for a PP visit  Discharge Medications:   Medication List    TAKE these medications        ibuprofen 600 MG tablet  Commonly known as:  ADVIL,MOTRIN  Take 1 tablet (600 mg total) by mouth every 6 (six) hours.     PRENATAL VITAMIN PO  Take 2 tablets by mouth every morning.       Follow-up Information    Follow up with [place] Bing, MD. Call in 4 weeks.   Specialty:  Obstetrics and Gynecology   Why:  post partum visit   Contact information:   [address] [place] [address] [phone]      Thomasene Mohair, MD 11/25/2015 11:05 AM

## 2015-11-23 NOTE — Progress Notes (Signed)
L&D Note  11/23/2015 - 8:33 AM  25 y.o. G1 [redacted]w[redacted]d (EDC 1/19, LMP=5wk u/s). Pregnancy complicated by scant PNC, GBS pos, BMI 33. Pt diagnosed with gestational thrombocytopenia on admission  Charlene Smith is admitted for PDIOL   Subjective:  Feeling occasional UCs. No s/s of pre-eclampsia   Objective:   Filed Vitals:   11/22/15 2049 11/22/15 2050 11/22/15 2335 11/23/15 0347  BP:  134/88 123/79 127/88  Pulse:  98 86 70  Temp:  98.4 F (36.9 C) 98.5 F (36.9 C) 98.3 F (36.8 C)  TempSrc:  Oral Oral Oral  Resp:  16    Height:  (1.727 m)     Weight: 215 lb (97.523 kg)       Current Vital Signs 24h Vital Sign Ranges  T 98.3 F (36.8 C) Temp  Avg: 98.4 F (36.9 C)  Min: 98.3 F (36.8 C)  Max: 98.5 F (36.9 C)  BP 127/88 mmHg BP  Min: 123/79  Max: 134/88  HR 70 Pulse  Avg: 84.7  Min: 70  Max: 98  RR 16 Resp  Avg: 16  Min: 16  Max: 16  SaO2   98/RA No Data Recorded       24 Hour I/O Current Shift I/O  Time Ins Outs       FHR: 125 baseline, +accels, no decel, mod var Toco: irregular, +irritability Gen: NAD SVE: deferred   Recent Labs Lab 11/22/15 2118  WBC 9.4  HGB 11.1*  HCT 33.7*  PLT 144*   Medications Current Facility-Administered Medications  Medication Dose Route Frequency Provider Last Rate Last Dose  . acetaminophen (TYLENOL) tablet 650 mg  650 mg Oral Q4H PRN Nadara Mustard, MD   650 mg at 11/23/15 0424  . butorphanol (STADOL) injection 1 mg  1 mg Intravenous Q1H PRN Nadara Mustard, MD      . lactated ringers infusion 500-1,000 mL  500-1,000 mL Intravenous PRN Nadara Mustard, MD      . lactated ringers infusion   Intravenous Continuous Nadara Mustard, MD 125 mL/hr at 11/23/15 0732    . ondansetron (ZOFRAN) injection 4 mg  4 mg Intravenous Q6H PRN Nadara Mustard, MD      . oxytocin (PITOCIN) IV BOLUS FROM BAG  500 mL Intravenous Continuous Nadara Mustard, MD      . oxytocin (PITOCIN) IV infusion 40 units in LR 1000 mL - Premix  2.5  Units/hr Intravenous Continuous Nadara Mustard, MD      . penicillin G potassium 2.5 Million Units in dextrose 5 % 100 mL IVPB  2.5 Million Units Intravenous Q4H Nadara Mustard, MD   2.5 Million Units at 11/23/15 0731  . terbutaline (BRETHINE) injection 0.25 mg  0.25 mg Subcutaneous Once PRN Nadara Mustard, MD      . zolpidem [facility]) tablet 5 mg  5 mg Oral QHS PRN Nadara Mustard, MD   5 mg at 11/22/15 2329    Assessment & Plan:  Pt doing well *IUP: category I with accels *IOL: cervidil due out at approx 1130am. Recheck cx and d/w pt that may start pitocin or may need more ripening *Scant PNC: UDS ordered. Pt states she never had a 1hr GCT and one isn't in the chart. Will do 1h GCT while here. Pt hasn't had anything to eat or drink since MN and PCN is running in. So will saline lock IV until glucoa is drawn *Gestational thrombocytopenia: PC ratio  and CMP ordered.  *GBS pos: will stop PCN until in active labor or SROM *Analgesia: no needs currently  Cornelia Copa MD [facility] Pager [phone]

## 2015-11-23 NOTE — Discharge Instructions (Addendum)
° °Vaginal Delivery, Care After °Refer to this sheet in the next few weeks. These discharge instructions provide you with information on caring for yourself after delivery. Your caregiver may also give you specific instructions. Your treatment has been planned according to the most current medical practices available, but problems sometimes occur. Call your caregiver if you have any problems or questions after you go home. °HOME CARE INSTRUCTIONS °1. Take over-the-counter or prescription medicines only as directed by your caregiver or pharmacist. °2. Do not drink alcohol, especially if you are breastfeeding or taking medicine to relieve pain. °3. Do not smoke tobacco. °4. Continue to use good perineal care. Good perineal care includes: °1. Wiping your perineum from back to front °2. Keeping your perineum clean. °3. You can do sitz baths twice a day, to help keep this area clean °5. Do not use tampons, douche or have sex until your caregiver says it is okay. °6. Shower only and avoid sitting in submerged water, aside from sitz baths °7. Wear a well-fitting bra that provides breast support. °8. Eat healthy foods. °9. Drink enough fluids to keep your urine clear or pale yellow. °10. Eat high-fiber foods such as whole grain cereals and breads, brown rice, beans, and fresh fruits and vegetables every day. These foods may help prevent or relieve constipation. °11. Avoid constipation with high fiber foods or medications, such as miralax or metamucil °12. Follow your caregiver's recommendations regarding resumption of activities such as climbing stairs, driving, lifting, exercising, or traveling. °13. Talk to your caregiver about resuming sexual activities. Resumption of sexual activities is dependent upon your risk of infection, your rate of healing, and your comfort and desire to resume sexual activity. °14. Try to have someone help you with your household activities and your newborn for at least a few days after you  leave the hospital. °15. Rest as much as possible. Try to rest or take a nap when your newborn is sleeping. °16. Increase your activities gradually. °17. Keep all of your scheduled postpartum appointments. It is very important to keep your scheduled follow-up appointments. At these appointments, your caregiver will be checking to make sure that you are healing physically and emotionally. °SEEK MEDICAL CARE IF:  °· You are passing large clots from your vagina. Save any clots to show your caregiver. °· You have a foul smelling discharge from your vagina. °· You have trouble urinating. °· You are urinating frequently. °· You have pain when you urinate. °· You have a change in your bowel movements. °· You have increasing redness, pain, or swelling near your vaginal incision (episiotomy) or vaginal tear. °· You have pus draining from your episiotomy or vaginal tear. °· Your episiotomy or vaginal tear is separating. °· You have painful, hard, or reddened breasts. °· You have a severe headache. °· You have blurred vision or see spots. °· You feel sad or depressed. °· You have thoughts of hurting yourself or your newborn. °· You have questions about your care, the care of your newborn, or medicines. °· You are dizzy or light-headed. °· You have a rash. °· You have nausea or vomiting. °· You were breastfeeding and have not had a menstrual period within 12 weeks after you stopped breastfeeding. °· You are not breastfeeding and have not had a menstrual period by the 12th week after delivery. °· You have a fever. °SEEK IMMEDIATE MEDICAL CARE IF:  °· You have persistent pain. °· You have chest pain. °· You have shortness of breath. °·   You faint. °· You have leg pain. °· You have stomach pain. °· Your vaginal bleeding saturates two or more sanitary pads in 1 hour. °MAKE SURE YOU:  °· Understand these instructions. °· Will watch your condition. °· Will get help right away if you are not doing well or get worse. °Document Released:  10/10/2000 Document Revised: 02/27/2014 Document Reviewed: 06/09/2012 °[employer]® Patient Information ©2015 [employer]. This information is not intended to replace advice given to you by your health care provider. Make sure you discuss any questions you have with your health care provider. ° °Sitz Bath °A sitz bath is a warm water bath taken in the sitting position. The water covers only the hips and butt (buttocks). We recommend using one that fits in the toilet, to help with ease of use and cleanliness. It may be used for either healing or cleaning purposes. Sitz baths are also used to relieve pain, itching, or muscle tightening (spasms). The water may contain medicine. Moist heat will help you heal and relax.  °HOME CARE  °Take 3 to 4 sitz baths a day. °18. Fill the bathtub half-full with warm water. °19. Sit in the water and open the drain a little. °20. Turn on the warm water to keep the tub half-full. Keep the water running constantly. °21. Soak in the water for 15 to 20 minutes. °22. After the sitz bath, pat the affected area dry. °GET HELP RIGHT AWAY IF: °You get worse instead of better. Stop the sitz baths if you get worse. °MAKE SURE YOU: °· Understand these instructions. °· Will watch your condition. °· Will get help right away if you are not doing well or get worse. °Document Released: 11/20/2004 Document Revised: 07/07/2012 Document Reviewed: 02/10/2011 °[employer]® Patient Information ©2015 [employer]. This information is not intended to replace advice given to you by your health care provider. Make sure you discuss any questions you have with your health care provider. ° °Call your doctor for increased pain or vaginal bleeding, temperature above 100.4, depression, or concerns.  No strenuous activity or heavy lifting for 6 weeks.  No intercourse, tampons, douching, or enemas for 6 weeks.  No tub baths-showers only.  No driving for 2 weeks or while taking pain medications.  Continue prenatal vitamin  and iron.  Increase calories and fluids while breastfeeding.   °

## 2015-11-23 NOTE — Anesthesia Procedure Notes (Signed)
Epidural Patient location during procedure: OB Start time: 11/23/2015 12:30 PM End time: 11/23/2015 12:40 PM  Staffing Anesthesiologist: Naomie Dean Resident/CRNA: Ginger Carne Performed by: resident/CRNA   Preanesthetic Checklist Completed: patient identified, site marked, surgical consent, pre-op evaluation, timeout performed, IV checked, risks and benefits discussed and monitors and equipment checked  Epidural Patient position: sitting Prep: Betadine Patient monitoring: heart rate, continuous pulse ox and blood pressure Approach: midline Location: L4-L5 Injection technique: LOR saline  Needle:  Needle type: Tuohy  Needle gauge: 18 G Needle length: 9 cm and 9 Catheter type: closed end flexible Catheter size: 20 Guage Test dose: negative and 1.5% lidocaine with Epi 1:200 K  Assessment Sensory level: T5 Events: blood not aspirated, injection not painful, no injection resistance, negative IV test and no paresthesia  Additional Notes   Patient tolerated the insertion well without complications.Reason for block:procedure for pain

## 2015-11-23 NOTE — Progress Notes (Signed)
RN at the bedside, pt. Complaining of lower back pain with ctx., 7-8/10. GlucoCrush 50g administered, orally. Lab called to draw blood at 0940. Lab will also draw CMP along with CBC.Marland Kitchenthe patient desires epidural later, as back labor and now lower abd. Pains are 7-8/10. FHR baseline 125 bpm, contractions 5-6 mins, each lasting 50-60 sec. RN  Will continue to monitor.

## 2015-11-23 NOTE — Progress Notes (Signed)
L&D Note  Cervidil out and SVE @ 1130 is 4/90/-1 and patient getting epidural. Fetus category I with accels and UCs about q2-38m but hard to tell now b/c pt is sitting up.  Will restart PCN (pt has received 2 doses already) and reassess at 1315. If UCs have spaced out, then will start pitocin. If still contracting well on her own, will recheck later in the afternoon and consider AROM at that time. Glucola negative and PC and CMP negative.   Cornelia Copa MD [facility]  Pager: [phone]

## 2015-11-24 ENCOUNTER — Encounter: Payer: Self-pay | Admitting: Obstetrics and Gynecology

## 2015-11-24 LAB — HEMATOCRIT: HEMATOCRIT: 31.5 % — AB (ref 35.0–47.0)

## 2015-11-24 LAB — RPR: RPR: NONREACTIVE

## 2015-11-24 MED ORDER — VARICELLA VIRUS VACCINE LIVE 1350 PFU/0.5ML IJ SUSR
0.5000 mL | INTRAMUSCULAR | Status: AC | PRN
  Administered 2015-11-25: 0.5 mL via SUBCUTANEOUS
  Filled 2015-11-24: qty 0.5

## 2015-11-24 NOTE — Anesthesia Postprocedure Evaluation (Signed)
Anesthesia Post Note  Patient: Charlene Smith  Procedure(s) Performed: * No procedures listed *  Patient location during evaluation: [facility] Anesthesia Type: Epidural Level of consciousness: awake and oriented Pain management: satisfactory to patient Vital Signs Assessment: post-procedure vital signs reviewed and stable Respiratory status: spontaneous breathing, nonlabored ventilation and respiratory function stable Cardiovascular status: stable Postop Assessment: no headache and no backache Anesthetic complications: no    Last Vitals:  Filed Vitals:   11/24/15 0420 11/24/15 0800  BP: 128/76 124/72  Pulse: 98 88  Temp: 36.9 C 36.6 C  Resp: 18 20    Last Pain:  Filed Vitals:   11/24/15 0929  PainSc: 0-No pain                 Cesia Orf,  Margit Banda

## 2015-11-24 NOTE — Progress Notes (Signed)
Daily PostpartumNote  Admission Date: 11/22/2015 Current Date: 11/24/2015  Charlene Smith is a 25 y.o. G1P1001. Patient was admitted for IOL post-dates.  She is postpartum day 1 from forceps assisted vaginal delivery.  Pregnancy complicated by:  Patient Active Problem List   Diagnosis Date Noted  . Gestational thrombocytopenia (HCC) 11/23/2015  . Post-dates pregnancy 11/22/2015  . Indication for care in labor and delivery, antepartum 11/13/2015  . Labor and delivery indication for care or intervention 08/07/2015    Subjective:  Patient is eating, drinking and ambulating. She has had no issues voiding bladder. Reports one instance of loose bowel movement that was difficult for her to control, states she still feels some numbness in her perineal area. She reports passing a large clot during her shower overnight, bleeding has decreased since. Pain is controlled with ibuprofen. Patient is breastfeeding and baby is latching well. She desires OCPs for contraception, beginning with the mini-pill while she is breastfeeding.  Objective:   Filed Vitals:   11/24/15 0420 11/24/15 0800  BP: 128/76 124/72  Pulse: 98 88  Temp: 98.5 F (36.9 C) 97.8 F (36.6 C)  Resp: 18 20   Temp:  [97.8 F (36.6 C)-100 F (37.8 C)] 97.8 F (36.6 C) (01/28 0800) Pulse Rate:  [79-106] 88 (01/28 0800) Resp:  [16-24] 20 (01/28 0800) BP: (101-145)/(57-104) 124/72 mmHg (01/28 0800) SpO2:  [100 %] 100 % (01/27 1405) Temp (24hrs), Avg:98.4 F (36.9 C), Min:97.8 F (36.6 C), Max:100 F (37.8 C)   Intake/Output Summary (Last 24 hours) at 11/24/15 0934 Last data filed at 11/24/15 0400  Gross per 24 hour  Intake 2824.85 ml  Output   1525 ml  Net 1299.85 ml     Current Vital Signs 24h Vital Sign Ranges  T 97.8 F (36.6 C) Temp  Avg: 98.4 F (36.9 C)  Min: 97.8 F (36.6 C)  Max: 100 F (37.8 C)  BP 124/72 mmHg BP  Min: 101/57  Max: 145/91  HR 88 Pulse  Avg: 91.1  Min: 79  Max: 106  RR 20 Resp   Avg: 19.3  Min: 16  Max: 24  SaO2 100 % Not Delivered SpO2  Avg: 100 %  Min: 100 %  Max: 100 %       24 Hour I/O Current Shift I/O  Time Ins Outs 01/27 0701 - 01/28 0700 In: 2824.9 [I.V.:2824.9] Out: 1525 [Urine:1125]     Patient Vitals for the past 24 hrs:  BP Temp Temp src Pulse Resp SpO2  11/24/15 0800 124/72 mmHg 97.8 F (36.6 C) Oral 88 20 -  11/24/15 0420 128/76 mmHg 98.5 F (36.9 C) Oral 98 18 -  11/23/15 2354 - 98.8 F (37.1 C) Oral - - -  11/23/15 2301 138/81 mmHg 100 F (37.8 C) Oral 92 18 -  11/23/15 2105 118/78 mmHg - - (!) 102 - -  11/23/15 2050 120/81 mmHg - - (!) 106 - -  11/23/15 2035 (!) 123/104 mmHg - - (!) 103 - -  11/23/15 1935 (!) 145/91 mmHg - - 94 - -  11/23/15 1910 140/86 mmHg 97.8 F (36.6 C) Oral 84 16 -  11/23/15 1838 137/89 mmHg - - 90 - -  11/23/15 1835 (!) 135/93 mmHg - - 90 - -  11/23/15 1735 136/83 mmHg - - 94 - -  11/23/15 1635 135/82 mmHg 98.1 F (36.7 C) Oral 86 - -  11/23/15 1535 113/60 mmHg - - 79 - -  11/23/15 1435 (!) 101/57 mmHg - -  79 - -  11/23/15 1405 - - - - - 100 %  11/23/15 1400 - - - - - 100 %  11/23/15 1355 - - - - - 100 %  11/23/15 1350 - - - - - 100 %  11/23/15 1345 - - - - - 100 %  11/23/15 1340 - - - - - 100 %  11/23/15 1335 125/79 mmHg - - 81 - 100 %  11/23/15 1330 - - - - - 100 %  11/23/15 1325 - - - - - 100 %  11/23/15 1320 125/82 mmHg - - 82 - 100 %  11/23/15 1315 - - - - - 100 %  11/23/15 1310 - - - - - 100 %  11/23/15 1305 119/71 mmHg - - 82 - 100 %  11/23/15 1300 119/73 mmHg - - 83 - 100 %  11/23/15 1257 117/74 mmHg - - 92 - -  11/23/15 1254 117/74 mmHg - - 92 - -  11/23/15 1250 123/73 mmHg - - 91 20 -  11/23/15 1245 140/77 mmHg - - 98 - -  11/23/15 1243 138/83 mmHg - - 99 - -  11/23/15 1241 139/82 mmHg - - 97 - -  11/23/15 1142 (!) 144/96 mmHg 98 F (36.7 C) Oral 95 (!) 24 -    Physical exam: General: Well nourished, well developed female in no acute distress. Abdomen: soft, nontender, uterine  fundus palpable and soft 1cm below umbilicus Cardiovascular: RRR, no MRGs Respiratory: CTAB Extremities: no clubbing, cyanosis or edema, negative Homan's sign Skin: Warm and dry.   Medications: Current Facility-Administered Medications  Medication Dose Route Frequency Provider Last Rate Last Dose  . acetaminophen (TYLENOL) tablet 650 mg  650 mg Oral Q4H PRN [place] Bing, MD      . benzocaine-Menthol (DERMOPLAST) 20-0.5 % topical spray 1 application  1 application Topical PRN [place] Bing, MD      . witch hazel-glycerin (TUCKS) pad 1 application  1 application Topical PRN [place] Bing, MD       And  . dibucaine (NUPERCAINAL) 1 % rectal ointment 1 application  1 application Rectal PRN [place] Bing, MD      . diphenhydrAMINE (BENADRYL) capsule 25 mg  25 mg Oral Q6H PRN [place] Bing, MD      . docusate sodium (COLACE) capsule 100 mg  100 mg Oral BID PRN [place] Bing, MD      . ferrous sulfate tablet 325 mg  325 mg Oral Q breakfast [place] Bing, MD   325 mg at 11/24/15 0810  . ibuprofen (ADVIL,MOTRIN) tablet 600 mg  600 mg Oral 4 times per day [place] Bing, MD   600 mg at 11/24/15 0500  . Influenza vac split quadrivalent PF (FLUARIX) injection 0.5 mL  0.5 mL Intramuscular Tomorrow-1000 [place] Bing, MD      . lanolin ointment   Topical PRN [place] Bing, MD      . magnesium hydroxide (MILK OF MAGNESIA) suspension 30 mL  30 mL Oral Q3 days PRN [place] Bing, MD      . ondansetron (ZOFRAN) tablet 4 mg  4 mg Oral Q4H PRN [place] Bing, MD       Or  . ondansetron (ZOFRAN) injection 4 mg  4 mg Intravenous Q4H PRN [place] Bing, MD      . oxyCODONE-acetaminophen (PERCOCET/ROXICET) 5-325 MG per tablet 1 tablet  1 tablet Oral Q6H PRN  Bing, MD      . oxyCODONE-acetaminophen (PERCOCET/ROXICET) 5-325 MG per tablet 2 tablet  2  tablet Oral Q6H PRN [place] Bing, MD      . oxytocin (PITOCIN) IV infusion 40 units in LR 1000 mL - Premix  2.5 Units/hr Intravenous  Continuous PRN [place] Bing, MD   2.5 Units/hr at 11/23/15 2300  . prenatal multivitamin tablet 1 tablet  1 tablet Oral Q1200 [place] Bing, MD      . simethicone (MYLICON) chewable tablet 80 mg  80 mg Oral PRN [place] Bing, MD      . Tdap (BOOSTRIX) injection 0.5 mL  0.5 mL Intramuscular Once [place] Bing, MD      . terbutaline (BRETHINE) injection 0.25 mg  0.25 mg Subcutaneous Once PRN [place] Mall, CNM      . zolpidem (AMBIEN) tablet 5 mg  5 mg Oral QHS PRN [place] Bing, MD       Facility-Administered Medications Ordered in Other Encounters  Medication Dose Route Frequency Provider Last Rate Last Dose  . bupivacaine (PF) (MARCAINE) 0.25 % injection    Anesthesia Intra-op Ginger Carne, CRNA   4 mL at 11/23/15 1245  . lidocaine-EPINEPHrine (XYLOCAINE W/EPI) 1 %-1:100000 (with pres) injection    Anesthesia Intra-op Ginger Carne, CRNA   3 mL at 11/23/15 1243    Labs:  Recent Labs Lab 11/22/15 2118 11/23/15 0944 11/24/15 0623  WBC 9.4 11.5*  --   HGB 11.1* 11.8*  --   HCT 33.7* 35.8 31.5*  PLT 144* 142*  --     Assessment & Plan:  25 y.o. G1P1001 PPD#1 s/p FAVD, doing well  1) Acute blood loss anemia - hemodynamically stable and asymptomatic - po ferrous sulfate  2) O pos / Rubella Immune Varicella Not immune - varivax dose #1 prior to discharge  3) TDAP status not given during pregnancy.  4) breast feeding going well /Contraception = oral progesterone-only contraceptive  5) Disposition home today or tomorrow.  Shana Hornbeck, PA-S 11/24/2015 10:05 AM   I personally saw and examined the patient and have edited this note to reflect my interaction with and plan for the patient.  Thomasene Mohair, MD 11/24/2015 10:06 AM

## 2015-11-25 MED ORDER — IBUPROFEN 600 MG PO TABS: 600.0000 mg | ORAL_TABLET | Freq: Four times a day (QID) | ORAL | Status: DC

## 2015-11-25 MED ORDER — INFLUENZA VAC SPLIT QUAD 0.5 ML IM SUSY: 0.5000 mL | PREFILLED_SYRINGE | INTRAMUSCULAR | Status: DC | PRN

## 2015-11-25 NOTE — Progress Notes (Signed)
Discharge instructions provided.  Pt and sig other verbalize understanding of all instructions and follow-up care.  Prescription given.  Pt discharged to home with infant at 1410 on 11/25/15 via wheelchair by RN. Reynold Bowen, RN 11/25/2015 2:39 PM

## 2015-11-25 NOTE — Progress Notes (Signed)
Prenatal records indicate that pt received TDaP vaccine on 01/24/10.  Education provided on need to receive TDaP vaccine.  Pt declines vaccine at this time. Reynold Bowen, RN 11/25/2015 12:23 PM

## 2018-01-18 ENCOUNTER — Encounter: Payer: Self-pay | Admitting: Advanced Practice Midwife

## 2018-01-18 ENCOUNTER — Ambulatory Visit (INDEPENDENT_AMBULATORY_CARE_PROVIDER_SITE_OTHER): Payer: Medicaid Other | Admitting: Advanced Practice Midwife

## 2018-01-18 VITALS — BP 118/74 | Wt 208.0 lb

## 2018-01-18 DIAGNOSIS — Z113 Encounter for screening for infections with a predominantly sexual mode of transmission: Secondary | ICD-10-CM

## 2018-01-18 DIAGNOSIS — Z131 Encounter for screening for diabetes mellitus: Secondary | ICD-10-CM

## 2018-01-18 DIAGNOSIS — Z124 Encounter for screening for malignant neoplasm of cervix: Secondary | ICD-10-CM | POA: Diagnosis not present

## 2018-01-18 DIAGNOSIS — O0990 Supervision of high risk pregnancy, unspecified, unspecified trimester: Secondary | ICD-10-CM | POA: Diagnosis not present

## 2018-01-18 DIAGNOSIS — O99210 Obesity complicating pregnancy, unspecified trimester: Secondary | ICD-10-CM

## 2018-01-18 NOTE — Patient Instructions (Signed)
Prenatal Care WHAT IS PRENATAL CARE? Prenatal care is the process of caring for a pregnant woman before she gives birth. Prenatal care makes sure that she and her baby remain as healthy as possible throughout pregnancy. Prenatal care may be provided by a midwife, family practice health care provider, or a childbirth and pregnancy specialist (obstetrician). Prenatal care may include physical examinations, testing, treatments, and education on nutrition, lifestyle, and social support services. WHY IS PRENATAL CARE SO IMPORTANT? Early and consistent prenatal care increases the chance that you and your baby will remain healthy throughout your pregnancy. This type of care also decreases a baby's risk of being born too early (prematurely), or being born smaller than expected (small for gestational age). Any underlying medical conditions you may have that could pose a risk during your pregnancy are discussed during prenatal care visits. You will also be monitored regularly for any new conditions that may arise during your pregnancy so they can be treated quickly and effectively. WHAT HAPPENS DURING PRENATAL CARE VISITS? Prenatal care visits may include the following: Discussion Tell your health care provider about any new signs or symptoms you have experienced since your last visit. These might include:  Nausea or vomiting.  Increased or decreased level of energy.  Difficulty sleeping.  Back or leg pain.  Weight changes.  Frequent urination.  Shortness of breath with physical activity.  Changes in your skin, such as the development of a rash or itchiness.  Vaginal discharge or bleeding.  Feelings of excitement or nervousness.  Changes in your baby's movements.  You may want to write down any questions or topics you want to discuss with your health care provider and bring them with you to your appointment. Examination During your first prenatal care visit, you will likely have a complete  physical exam. Your health care provider will often examine your vagina, cervix, and the position of your uterus, as well as check your heart, lungs, and other body systems. As your pregnancy progresses, your health care provider will measure the size of your uterus and your baby's position inside your uterus. He or she may also examine you for early signs of labor. Your prenatal visits may also include checking your blood pressure and, after about 10-12 weeks of pregnancy, listening to your baby's heartbeat. Testing Regular testing often includes:  Urinalysis. This checks your urine for glucose, protein, or signs of infection.  Blood count. This checks the levels of white and red blood cells in your body.  Tests for sexually transmitted infections (STIs). Testing for STIs at the beginning of pregnancy is routinely done and is required in many states.  Antibody testing. You will be checked to see if you are immune to certain illnesses, such as rubella, that can affect a developing fetus.  Glucose screen. Around 24-28 weeks of pregnancy, your blood glucose level will be checked for signs of gestational diabetes. Follow-up tests may be recommended.  Group B strep. This is a bacteria that is commonly found inside a woman's vagina. This test will inform your health care provider if you need an antibiotic to reduce the amount of this bacteria in your body prior to labor and childbirth.  Ultrasound. Many pregnant women undergo an ultrasound screening around 18-20 weeks of pregnancy to evaluate the health of the fetus and check for any developmental abnormalities.  HIV (human immunodeficiency virus) testing. Early in your pregnancy, you will be screened for HIV. If you are at high risk for HIV, this test may   be repeated during your third trimester of pregnancy.  You may be offered other testing based on your age, personal or family medical history, or other factors. HOW OFTEN SHOULD I PLAN TO SEE MY  HEALTH CARE PROVIDER FOR PRENATAL CARE? Your prenatal care check-up schedule depends on any medical conditions you have before, or develop during, your pregnancy. If you do not have any underlying medical conditions, you will likely be seen for checkups:  Monthly, during the first 6 months of pregnancy.  Twice a month during months 7 and 8 of pregnancy.  Weekly starting in the 9th month of pregnancy and until delivery.  If you develop signs of early labor or other concerning signs or symptoms, you may need to see your health care provider more often. Ask your health care provider what prenatal care schedule is best for you. WHAT CAN I DO TO KEEP MYSELF AND MY BABY AS HEALTHY AS POSSIBLE DURING MY PREGNANCY?  Take a prenatal vitamin containing 400 micrograms (0.4 mg) of folic acid every day. Your health care provider may also ask you to take additional vitamins such as iodine, vitamin D, iron, copper, and zinc.  Take 1500-2000 mg of calcium daily starting at your 20th week of pregnancy until you deliver your baby.  Make sure you are up to date on your vaccinations. Unless directed otherwise by your health care provider: ? You should receive a tetanus, diphtheria, and pertussis (Tdap) vaccination between the 27th and 36th week of your pregnancy, regardless of when your last Tdap immunization occurred. This helps protect your baby from whooping cough (pertussis) after he or she is born. ? You should receive an annual inactivated influenza vaccine (IIV) to help protect you and your baby from influenza. This can be done at any point during your pregnancy.  Eat a well-rounded diet that includes: ? Fresh fruits and vegetables. ? Lean proteins. ? Calcium-rich foods such as milk, yogurt, hard cheeses, and dark, leafy greens. ? Whole grain breads.  Do noteat seafood high in mercury, including: ? Swordfish. ? Tilefish. ? Shark. ? King mackerel. ? More than 6 oz tuna per week.  Do not  eat: ? Raw or undercooked meats or eggs. ? Unpasteurized foods, such as soft cheeses (brie, blue, or feta), juices, and milks. ? Lunch meats. ? Hot dogs that have not been heated until they are steaming.  Drink enough water to keep your urine clear or pale yellow. For many women, this may be 10 or more 8 oz glasses of water each day. Keeping yourself hydrated helps deliver nutrients to your baby and may prevent the start of pre-term uterine contractions.  Do not use any tobacco products including cigarettes, chewing tobacco, or electronic cigarettes. If you need help quitting, ask your health care provider.  Do not drink beverages containing alcohol. No safe level of alcohol consumption during pregnancy has been determined.  Do not use any illegal drugs. These can harm your developing baby or cause a miscarriage.  Ask your health care provider or pharmacist before taking any prescription or over-the-counter medicines, herbs, or supplements.  Limit your caffeine intake to no more than 200 mg per day.  Exercise. Unless told otherwise by your health care provider, try to get 30 minutes of moderate exercise most days of the week. Do not  do high-impact activities, contact sports, or activities with a high risk of falling, such as horseback riding or downhill skiing.  Get plenty of rest.  Avoid anything that raises your   body temperature, such as hot tubs and saunas.  If you own a cat, do not empty its litter box. Bacteria contained in cat feces can cause an infection called toxoplasmosis. This can result in serious harm to the fetus.  Stay away from chemicals such as insecticides, lead, mercury, and cleaning or paint products that contain solvents.  Do not have any X-rays taken unless medically necessary.  Take a childbirth and breastfeeding preparation class. Ask your health care provider if you need a referral or recommendation.  This information is not intended to replace advice given  to you by your health care provider. Make sure you discuss any questions you have with your health care provider. Document Released: 10/16/2003 Document Revised: 03/17/2016 Document Reviewed: 12/28/2013 Else[employer] Interactive Patient Education  2017 [employer] Exercise During Pregnancy For people of all ages, exercise is an important part of being healthy. Exercise improves heart and lung function and helps to maintain strength, flexibility, and a healthy body weight. Exercise also boosts energy levels and elevates mood. For most women, maintaining an exercise routine throughout pregnancy is recommended. It is only on rare occasions and with certain medical conditions or pregnancy complications that women may be asked to limit or avoid exercise during pregnancy. What are some other benefits to exercising during pregnancy? Along with maintaining strength and flexibility, exercising throughout pregnancy can help to:  Keep strength in muscles that are very important during labor and childbirth.  Decrease low back pain during pregnancy.  Decrease the risk of developing gestational diabetes mellitus (GDM).  Improve blood sugar (glucose) control for women who have GDM.  Decrease the risk of developing preeclampsia. This is a serious condition that causes high blood pressure along with other symptoms, such as swelling and headaches.  Decrease the risk of cesarean delivery.  Speed up the recovery after giving birth.  How often should I exercise? Unless your health care provider gives you different instructions, you should try to exercise on most days or all days of the week. In general, try to exercise with moderate intensity for about 150 minutes per week. This can be spread out across several days, such as exercising for 30 minutes per day on 5 days of each week. You can tell that you are exercising at a moderate intensity if you have a higher heart rate and faster breathing, but you are still  able to hold a conversation. What types of moderate-intensity exercise are recommended during pregnancy? There are many types of exercise that are safe for you to do during pregnancy. Unless your health care provider gives you different instructions, do a variety of exercises that safely increase your heart and breathing (cardiopulmonary) rates and help you to build and maintain muscle strength (strength training). You should always be able to talk in full sentences while exercising during pregnancy. Some examples of exercising that is safe to do during pregnancy include:  Brisk walking or hiking.  Swimming.  Water aerobics.  Riding a stationary bike.  Strength training.  Modified yoga or Pilates. Tell your instructor that you are pregnant. Avoid overstretching and avoid lying on your back for long periods of time.  Running or jogging. Only choose this type of exercise if: ? You ran or jogged regularly before your pregnancy. ? You can run or jog and still talk in complete sentences.  What types of exercise should I not do during pregnancy? Depending on your level of fitness and whether you exercised regularly before your pregnancy, you may be   advised to limit vigorous-intensity exercise during your pregnancy. You can tell that you are exercising at a vigorous intensity if you are breathing much harder and faster and cannot hold a conversation while exercising. Some examples of exercising that you should avoid during pregnancy include:  Contact sports.  Activities that place you at risk for falling on or being hit in the belly, such as downhill skiing, water skiing, surfing, rock climbing, cycling, gymnastics, and horseback riding.  Scuba diving.  Sky diving.  Yoga or Pilates in a room that is heated to extreme temperatures ("hot yoga" or "hot Pilates").  Jogging or running, unless you ran or jogged regularly before your pregnancy. While jogging or running, you should always be able  to talk in full sentences. Do not run or jog so vigorously that you are unable to have a conversation.  If you are not used to exercising at elevation (more than 6,000 feet above sea level), do not do so during your pregnancy.  When should I avoid exercising during pregnancy? Certain medical conditions can make it unsafe to exercise during pregnancy, or they may increase your risk of miscarriage or early labor and birth. Some of these conditions include:  Some types of heart disease.  Some types of lung disease.  Placenta previa. This is when the placenta partially or completely covers the opening of the uterus (cervix).  Frequent bleeding from the vagina during your pregnancy.  Incompetent cervix. This is when your cervix does not remain as tightly closed during pregnancy as it should.  Premature labor.  Ruptured membranes. This is when the protective sac (amniotic sac) opens up and amniotic fluid leaks from your vagina.  Severely low blood count (anemia).  Preeclampsia or pregnancy-caused high blood pressure.  Carrying more than one baby (multiple gestation) and having an additional risk of early labor.  Poorly controlled diabetes.  Being severely underweight or severely overweight.  Intrauterine growth restriction. This is when your baby's growth and development during pregnancy are slower than expected.  Other medical conditions. Ask your health care provider if any apply to you.  What else should I know about exercising during pregnancy? You should take these precautions while exercising during pregnancy:  Avoid overheating. ? Wear loose-fitting, breathable clothes. ? Do not exercise in very high temperatures.  Avoid dehydration. Drink enough water before, during, and after exercise to keep your urine clear or pale yellow.  Avoid overstretching. Because of hormone changes during pregnancy, it is easy to overstretch muscles, tendons, and ligaments during  pregnancy.  Start slowly and ask your health care provider to recommend types of exercise that are safe for you, if exercising regularly is new for you.  Pregnancy is not a time for exercising to lose weight. When should I seek medical care? You should stop exercising and call your health care provider if you have any unusual symptoms, such as:  Mild uterine contractions or abdominal cramping.  Dizziness that does not improve with rest.  When should I seek immediate medical care? You should stop exercising and call your local emergency services (911 in the [place] if you have any unusual symptoms, such as:  Sudden, severe pain in your low back or your belly.  Uterine contractions or abdominal cramping that do not improve with rest.  Chest pain.  Bleeding or fluid leaking from your vagina.  Shortness of breath.  This information is not intended to replace advice given to you by your health care provider. Make sure you discuss any   questions you have with your health care provider. Document Released: 10/13/2005 Document Revised: 03/12/2016 Document Reviewed: 12/21/2014 [employer] Interactive Patient Education  2018 [employer] Eating Plan for Pregnant Women While you are pregnant, your body will require additional nutrition to help support your growing baby. It is recommended that you consume:  150 additional calories each day during your first trimester.  300 additional calories each day during your second trimester.  300 additional calories each day during your third trimester.  Eating a healthy, well-balanced diet is very important for your health and for your baby's health. You also have a higher need for some vitamins and minerals, such as folic acid, calcium, iron, and vitamin D. What do I need to know about eating during pregnancy?  Do not try to lose weight or go on a diet during pregnancy.  Choose healthy, nutritious foods. Choose  of a sandwich with a glass of milk  instead of a candy bar or a high-calorie sugar-sweetened beverage.  Limit your overall intake of foods that have "empty calories." These are foods that have little nutritional value, such as sweets, desserts, candies, sugar-sweetened beverages, and fried foods.  Eat a variety of foods, especially fruits and vegetables.  Take a prenatal vitamin to help meet the additional needs during pregnancy, specifically for folic acid, iron, calcium, and vitamin D.  Remember to stay active. Ask your health care provider for exercise recommendations that are specific to you.  Practice good food safety and cleanliness, such as washing your hands before you eat and after you prepare raw meat. This helps to prevent foodborne illnesses, such as listeriosis, that can be very dangerous for your baby. Ask your health care provider for more information about listeriosis. What does 150 extra calories look like? Healthy options for an additional 150 calories each day could be any of the following:  Plain low-fat yogurt (6-8 oz) with  cup of berries.  1 apple with 2 teaspoons of peanut butter.  Cut-up vegetables with  cup of hummus.  Low-fat chocolate milk (8 oz or 1 cup).  1 string cheese with 1 medium orange.   of a peanut butter and jelly sandwich on whole-wheat bread (1 tsp of peanut butter).  For 300 calories, you could eat two of those healthy options each day. What is a healthy amount of weight to gain? The recommended amount of weight for you to gain is based on your pre-pregnancy BMI. If your pre-pregnancy BMI was:  Less than 18 (underweight), you should gain 28-40 lb.  18-24.9 (normal), you should gain 25-35 lb.  25-29.9 (overweight), you should gain 15-25 lb.  Greater than 30 (obese), you should gain 11-20 lb.  What if I am having twins or multiples? Generally, pregnant women who will be having twins or multiples may need to increase their daily calories by 300-600 calories each day. The  recommended range for total weight gain is 25-54 lb, depending on your pre-pregnancy BMI. Talk with your health care provider for specific guidance about additional nutritional needs, weight gain, and exercise during your pregnancy. What foods can I eat? Grains Any grains. Try to choose whole grains, such as whole-wheat bread, oatmeal, or brown rice. Vegetables Any vegetables. Try to eat a variety of colors and types of vegetables to get a full range of vitamins and minerals. Remember to wash your vegetables well before eating. Fruits Any fruits. Try to eat a variety of colors and types of fruit to get a full range of vitamins and   minerals. Remember to wash your fruits well before eating. Meats and Other Protein Sources Lean meats, including chicken, turkey, fish, and lean cuts of beef, veal, or pork. Make sure that all meats are cooked to "well done." Tofu. Tempeh. Beans. Eggs. Peanut butter and other nut butters. Seafood, such as shrimp, crab, and lobster. If you choose fish, select types that are higher in omega-3 fatty acids, including salmon, herring, mussels, trout, sardines, and pollock. Make sure that all meats are cooked to food-safe temperatures. Dairy Pasteurized milk and milk alternatives. Pasteurized yogurt and pasteurized cheese. Cottage cheese. Sour cream. Beverages Water. Juices that contain 100% fruit juice or vegetable juice. Caffeine-free teas and decaffeinated coffee. Drinks that contain caffeine are okay to drink, but it is better to avoid caffeine. Keep your total caffeine intake to less than 200 mg each day (12 oz of coffee, tea, or soda) or as directed by your health care provider. Condiments Any pasteurized condiments. Sweets and Desserts Any sweets and desserts. Fats and Oils Any fats and oils. The items listed above may not be a complete list of recommended foods or beverages. Contact your dietitian for more options. What foods are not  recommended? Vegetables Unpasteurized (raw) vegetable juices. Fruits Unpasteurized (raw) fruit juices. Meats and Other Protein Sources Cured meats that have nitrates, such as bacon, salami, and hotdogs. Luncheon meats, bologna, or other deli meats (unless they are reheated until they are steaming hot). Refrigerated pate, meat spreads from a meat counter, smoked seafood that is found in the refrigerated section of a store. Raw fish, such as sushi or sashimi. High mercury content fish, such as tilefish, shark, swordfish, and king mackerel. Raw meats, such as tuna or beef tartare. Undercooked meats and poultry. Make sure that all meats are cooked to food-safe temperatures. Dairy Unpasteurized (raw) milk and any foods that have raw milk in them. Soft cheeses, such as feta, queso blanco, queso fresco, Brie, Camembert cheeses, blue-veined cheeses, and Panela cheese (unless it is made with pasteurized milk, which must be stated on the label). Beverages Alcohol. Sugar-sweetened beverages, such as sodas, teas, or energy drinks. Condiments Homemade fermented foods and drinks, such as pickles, sauerkraut, or kombucha drinks. (Store-bought pasteurized versions of these are okay.) Other Salads that are made in the store, such as ham salad, chicken salad, egg salad, tuna salad, and seafood salad. The items listed above may not be a complete list of foods and beverages to avoid. Contact your dietitian for more information. This information is not intended to replace advice given to you by your health care provider. Make sure you discuss any questions you have with your health care provider. Document Released: 07/28/2014 Document Revised: 03/20/2016 Document Reviewed: 03/28/2014 [employer] Interactive Patient Education  2018 [employer]  

## 2018-01-18 NOTE — Progress Notes (Signed)
New Obstetric Patient H&P    Chief Complaint: "Desires prenatal care"   History of Present Illness: Patient is a 27 y.o. G2P1001 Not Hispanic or Latino female, presents with amenorrhea and positive home pregnancy test. Patient's last menstrual period was 11/27/2017. and based on her  LMP, her EDD is Estimated Date of Delivery: 09/03/2018. and her EGA is 7 weeks 3 days. Cycles are 5. days, regular, and occur approximately every : 28 days. Her last pap smear was 3 years ago and was ASCUS with NEGATIVE high risk HPV.    She had a urine pregnancy test which was positive 2 week(s)  ago. Her last menstrual period was normal and lasted for  5 day(s). Since her LMP she claims she has experienced nausea. She denies vaginal bleeding. Her past medical history is contributory for obesity. Her prior pregnancies are notable for full term SVD  Since her LMP, she admits to the use of tobacco products  no She claims she has gained   8 pounds since the start of her pregnancy.  There are cats in the home in the home  yes If yes Indoor She admits close contact with children on a regular basis  yes  She has had chicken pox in the past unknown She has had Tuberculosis exposures, symptoms, or previously tested positive for TB   no Current or past history of domestic violence. no  Genetic Screening/Teratology Counseling: (Includes patient, baby's father, or anyone in either family with:)   1. Patient's age >/= [address] [facility]  no 2. Thalassemia ([place], [place], Mediterranean, or Asian background): MCV<80  no 3. Neural tube defect (meningomyelocele, spina bifida, anencephaly)  no 4. Congenital heart defect  no  5. Down syndrome  no 6. Tay-Sachs (Jewish, [place]French Canadian)  no 7. Canavan's Disease  no 8. Sickle cell disease or trait (African)  no  9. Hemophilia or other blood disorders  no  10. Muscular dystrophy  no  11. Cystic fibrosis  no  12. Huntington's Chorea  no  13. Mental retardation/autism  no 14. Other  inherited genetic or chromosomal disorder  no 15. Maternal metabolic disorder (DM, PKU, etc)  no 16. Patient or FOB with a child with a birth defect not listed above no  16a. Patient or FOB with a birth defect themselves no 17. Recurrent pregnancy loss, or stillbirth  no  18. Any medications since LMP other than prenatal vitamins (include vitamins, supplements, OTC meds, drugs, alcohol)  no 19. Any other genetic/environmental exposure to discuss  no  Infection History:   1. Lives with someone with TB or TB exposed  no  2. Patient or partner has history of genital herpes  no 3. Rash or viral illness since LMP  no 4. History of STI (GC, CT, HPV, syphilis, HIV)  no 5. History of recent travel :  no  Other pertinent information:  no     Review of Systems:10 point review of systems negative unless otherwise noted in HPI  Past Medical History:  Past Medical History:  Diagnosis Date  . Medical history non-contributory     Past Surgical History:  Past Surgical History:  Procedure Laterality Date  . NO PAST SURGERIES      Gynecologic History: Patient's last menstrual period was 11/27/2017.  Obstetric History: G2P1001  Family History:  Family History  Problem Relation Age of Onset  . Lung cancer Father     Social History:  Social History   Socioeconomic History  . Marital status:  Single    Spouse name: Not on file  . Number of children: Not on file  . Years of education: Not on file  . Highest education level: Not on file  Occupational History  . Not on file  Social Needs  . Financial resource strain: Not on file  . Food insecurity:    Worry: Not on file    Inability: Not on file  . Transportation needs:    Medical: Not on file    Non-medical: Not on file  Tobacco Use  . Smoking status: Never Smoker  . Smokeless tobacco: Never Used  Substance and Sexual Activity  . Alcohol use: No  . Drug use: No  . Sexual activity: Yes    Birth control/protection: None    Lifestyle  . Physical activity:    Days per week: Not on file    Minutes per session: Not on file  . Stress: Not on file  Relationships  . Social connections:    Talks on phone: Not on file    Gets together: Not on file    Attends religious service: Not on file    Active member of club or organization: Not on file    Attends meetings of clubs or organizations: Not on file    Relationship status: Not on file  . Intimate partner violence:    Fear of current or ex partner: Not on file    Emotionally abused: Not on file    Physically abused: Not on file    Forced sexual activity: Not on file  Other Topics Concern  . Not on file  Social History Narrative  . Not on file    Allergies:  No Known Allergies  Medications: Prior to Admission medications   Medication Sig Start Date End Date Taking? Authorizing Provider  Prenatal Vit-Fe Fumarate-FA (PRENATAL VITAMIN PO) Take 2 tablets by mouth every morning.   Yes [provider]    Physical Exam Vitals: Blood pressure 118/74, weight 208 lb (94.3 kg), last menstrual period 11/27/2017  General: NAD HEENT: normocephalic, anicteric Thyroid: no enlargement, no palpable nodules Pulmonary: No increased work of breathing, CTAB Cardiovascular: RRR, distal pulses 2+ Abdomen: NABS, soft, non-tender, non-distended.  Umbilicus without lesions.  No hepatomegaly, splenomegaly or masses palpable. No evidence of hernia  Genitourinary:  External: Normal external female genitalia.  Normal urethral meatus, normal  Bartholin's and Skene's glands.    Vagina: Normal vaginal mucosa, no evidence of prolapse.    Cervix: Grossly normal in appearance, no bleeding, unable to visualize cervix due to body habitus and patient discomfort with exam  Uterus: deferred for no concerns/symptoms  Adnexa: deferred for no concerns/symptoms  Rectal: deferred Extremities: no edema, erythema, or tenderness Neurologic: Grossly intact Psychiatric: mood  appropriate, affect full   Assessment: 27 y.o. G2P1001 at 7 weeks 3 days by LMP presenting to initiate prenatal care  Plan: 1) Avoid alcoholic beverages. 2) Patient encouraged not to smoke.  3) Discontinue the use of all non-medicinal drugs and chemicals.  4) Take prenatal vitamins daily.  5) Nutrition, food safety (fish, cheese advisories, and high nitrite foods) and exercise discussed. 6) Hospital and practice style discussed with cross coverage system.  7) Genetic Screening, such as with 1st Trimester Screening, cell free fetal DNA, AFP testing, and Ultrasound, as well as with amniocentesis and CVS as appropriate, is discussed with patient. At the conclusion of today's visit patient requested genetic testing 8) Patient is asked about travel to areas at risk for the [place]  virus, and counseled to avoid travel and exposure to mosquitoes or sexual partners who may have themselves been exposed to the virus. Testing is discussed, and will be ordered as appropriate.  9) Dating scan in 1 week   Tresea Mall, CNM [facility], [facility] 01/18/2018, 12:02 PM

## 2018-01-18 NOTE — Progress Notes (Signed)
NOB today. No vb. No lof ?

## 2018-01-20 LAB — IGP,CTNGTV,RFX APTIMA HPV ASCU
CHLAMYDIA, NUC. ACID AMP: NEGATIVE
Gonococcus, Nuc. Acid Amp: NEGATIVE
PAP SMEAR COMMENT: 0
Trich vag by NAA: NEGATIVE

## 2018-01-20 LAB — URINE CULTURE: ORGANISM ID, BACTERIA: NO GROWTH

## 2018-01-21 ENCOUNTER — Telehealth: Payer: Self-pay

## 2018-01-21 ENCOUNTER — Ambulatory Visit (INDEPENDENT_AMBULATORY_CARE_PROVIDER_SITE_OTHER): Payer: Medicaid Other | Admitting: Obstetrics and Gynecology

## 2018-01-21 VITALS — BP 112/60 | Wt 202.0 lb

## 2018-01-21 DIAGNOSIS — Z3A01 Less than 8 weeks gestation of pregnancy: Secondary | ICD-10-CM | POA: Diagnosis not present

## 2018-01-21 DIAGNOSIS — O0990 Supervision of high risk pregnancy, unspecified, unspecified trimester: Secondary | ICD-10-CM | POA: Diagnosis not present

## 2018-01-21 DIAGNOSIS — O200 Threatened abortion: Secondary | ICD-10-CM | POA: Diagnosis not present

## 2018-01-21 NOTE — Telephone Encounter (Signed)
Pt called with c/o early OB with light continuous bright red bleeding since 6:00 pm yesterday. Occasional cramping but not severed. Advised come in for appt for reassurance. Transferred to front desk for scheduling.

## 2018-01-21 NOTE — Progress Notes (Signed)
Patient ID: Charlene Smith, female   DOB: Jan 24, 1991, 27 y.o.   MRN: [id number]  Reason for Consult: Vaginal Bleeding   Referred by No ref. provider found  Subjective:     HPI:  Charlene Smith is a 27 y.o. female patient presents today with complaints of vaginal bleeding. She reports that the bleeding has been small with light pink/brown blood seen when she wipes. The bleeding started yesterday and has lasted for more than 24 hours. She denies abdominal pain or cramping. Patient recently initiated prenatal care. She has not had an [place] yet this pregnancy.  By LMP she is 7 weeks 6 days today.  She denies fever. She denies nausea or vomiting.   OB History  Gravida Para Term Preterm AB Living  2 1 1  0 0 1  SAB TAB Ectopic Multiple Live Births  0   0 0 1    # Outcome Date GA Lbr Len/2nd Weight Sex Delivery Anes PTL Lv  2 Current           1 Term 11/23/15 [redacted]w[redacted]d 20:36 / 00:35 7 lb 12.9 oz (3.54 kg) M Vag-Forceps EPI  LIV     Birth Comments: No obvious findings noted     Past Medical History:  Diagnosis Date  . Medical history non-contributory    Family History  Problem Relation Age of Onset  . Lung cancer Father    Past Surgical History:  Procedure Laterality Date  . NO PAST SURGERIES      Short Social History:  Social History   Tobacco Use  . Smoking status: Never Smoker  . Smokeless tobacco: Never Used  Substance Use Topics  . Alcohol use: No    No Known Allergies  Current Outpatient Medications  Medication Sig Dispense Refill  . Prenatal Vit-Fe Fumarate-FA (PRENATAL VITAMIN PO) Take 2 tablets by mouth every morning.     No current facility-administered medications for this visit.     Review of Systems  Constitutional: Negative for chills, fatigue, fever and unexpected weight change.  HENT: Negative for trouble swallowing.  Eyes: Negative for loss of vision.  Respiratory: Negative for cough, shortness of breath and wheezing.  Cardiovascular:  Negative for chest pain, leg swelling, palpitations and syncope.  GI: Negative for abdominal pain, blood in stool, diarrhea, nausea and vomiting.  GU: Negative for difficulty urinating, dysuria, frequency and hematuria.  Musculoskeletal: Negative for back pain, leg pain and joint pain.  Skin: Negative for rash.  Neurological: Negative for dizziness, headaches, light-headedness, numbness and seizures.  Psychiatric: Negative for behavioral problem, confusion, depressed mood and sleep disturbance.        Objective:  Objective   Vitals:   01/21/18 1519  BP: 112/60  Weight: 202 lb (91.6 kg)   Body mass index is 30.71 kg/m.  Physical Exam  Constitutional: She is oriented to person, place, and time. She appears well-developed and well-nourished.  HENT:  Head: Normocephalic and atraumatic.  Eyes: EOM are normal.  Cardiovascular: Normal rate, regular rhythm and normal heart sounds.  Pulmonary/Chest: Effort normal and breath sounds normal.  Genitourinary:  Genitourinary Comments: Speculum exam shows a small amount of brown blood in the vaginal vault. No active bleeding. Cervix appears slightly dilated visually, but on SVE the cervix is closed.  No CMT. Normal 8cm uterus anteverted, no adnexal masses or tenderness.   Neurological: She is alert and oriented to person, place, and time.  Skin: Skin is warm and dry.  Psychiatric: She has  a normal mood and affect. Her behavior is normal. Judgment and thought content normal.  Nursing note and vitals reviewed.   Data: Bedside transvaginal US showed a Gestational sac and yolk sac, no fetal pole.      Assessment/Plan:     [age] G2P1001 with threatened abortion Discussed warning signs of miscarriage. Encouraged patient to call if she experiences heavy vaginal bleeding or pain. Will follow up next week for official US and continued care.    Total of 25 minutes spent with patient and more than 50% of that time spent counseling patient.     Natale Milchhristanna R Schuman MD [facility], [facility] 01/24/18 10:43 AM

## 2018-01-21 NOTE — Progress Notes (Signed)
Vaginal bleeding (steady throughout day)

## 2018-01-24 ENCOUNTER — Encounter: Payer: Self-pay | Admitting: Obstetrics and Gynecology

## 2018-01-25 ENCOUNTER — Telehealth: Payer: Self-pay

## 2018-01-25 NOTE — Telephone Encounter (Signed)
Pt saw Schuman 01/21/18 for bleeding. Friday the bleeding go worse & cramping started. Yesterday she passed some tissue and has passed more today. Pt inquiring if she should bring what she passed to her apt tomorrow. ZO#[phone]#[phone]

## 2018-01-25 NOTE — Telephone Encounter (Signed)
Spoke w/pt. Notified she can bring the tissue with her if she would like. Possibly depending on the condition of the specimen, that it can be sent off to verify if POC, if the patient desires. Pt inquired if she will find out tomorrow after u/s if pregnancy is viable. Explained u/s can determine whether yolk sac, fetal pole, heart beat is present f patient is far enough along.

## 2018-01-26 ENCOUNTER — Other Ambulatory Visit: Payer: Self-pay | Admitting: Advanced Practice Midwife

## 2018-01-26 ENCOUNTER — Ambulatory Visit (INDEPENDENT_AMBULATORY_CARE_PROVIDER_SITE_OTHER): Payer: Medicaid Other

## 2018-01-26 ENCOUNTER — Other Ambulatory Visit: Payer: Medicaid Other

## 2018-01-26 ENCOUNTER — Ambulatory Visit (INDEPENDENT_AMBULATORY_CARE_PROVIDER_SITE_OTHER): Payer: Medicaid Other | Admitting: Certified Nurse Midwife

## 2018-01-26 VITALS — BP 112/62 | Wt 201.0 lb

## 2018-01-26 DIAGNOSIS — O200 Threatened abortion: Secondary | ICD-10-CM

## 2018-01-26 DIAGNOSIS — O0990 Supervision of high risk pregnancy, unspecified, unspecified trimester: Secondary | ICD-10-CM

## 2018-01-26 NOTE — Progress Notes (Signed)
Pt reports SAB over the weekend. Viability u/s today. C/o cramping over the weekend that has improved.

## 2018-01-27 LAB — BETA HCG QUANT (REF LAB): HCG QUANT: 1930 m[IU]/mL

## 2018-01-29 ENCOUNTER — Telehealth: Payer: Self-pay | Admitting: Certified Nurse Midwife

## 2018-01-29 ENCOUNTER — Other Ambulatory Visit: Payer: Medicaid Other

## 2018-01-29 DIAGNOSIS — O200 Threatened abortion: Secondary | ICD-10-CM

## 2018-01-29 NOTE — Telephone Encounter (Signed)
Called patient with quant results. Beta Hcg was 1930. Had another beta HCG done today. Her bleeding has slowed and is now dark in color. Will call with results and follow up plan after results are back from today's blood draw.  Charlene LauderColleen

## 2018-01-30 LAB — BETA HCG QUANT (REF LAB): HCG QUANT: 757 m[IU]/mL

## 2018-02-04 ENCOUNTER — Other Ambulatory Visit: Payer: Self-pay | Admitting: Certified Nurse Midwife

## 2018-02-04 DIAGNOSIS — O034 Incomplete spontaneous abortion without complication: Secondary | ICD-10-CM

## 2018-02-04 NOTE — Progress Notes (Signed)
27 year old G2 P1001 at 8wk4d by LMP, was seen last week 3/28 for vaginal bleeding/spotting which started on 3/27. On 3/29 she began cramping with the bleeding. On 3/31 the bleeding and cramping  increased and she began passing tissue. Since then the bleeding has lightened and she is having mild cramping. Dr Bjorn PippinSchumann did a bedside ultrasound on 3/28 which revealed a intrauterine gestational sac and a yolk sac.  On ultrasound today, there appears to be a intrauterine gestational sac, but there is no yolk sac or embryo. There are blood products within the endometrium and cervical canal. There are no masses in the adnexal area.  Blood type: O POS   General: BP 112/62 WF in NAD  Discussed results of ultrasound and possibility of incomplete vs threatened abortion and the possibility of an earlier pregnancy then the LMP suggests.  Serial beta HCGs today and Friday. If the betas are rising, will get ultrasound scheduled in 10-14 days. If betas are decreasing, will follow until they are negative.  Farrel Connersolleen Alyscia Carmon, CNM

## 2018-02-10 ENCOUNTER — Other Ambulatory Visit: Payer: Medicaid Other

## 2018-08-12 DIAGNOSIS — F1410 Cocaine abuse, uncomplicated: Secondary | ICD-10-CM | POA: Insufficient documentation

## 2018-11-26 ENCOUNTER — Encounter (HOSPITAL_COMMUNITY): Payer: Self-pay

## 2019-02-16 DIAGNOSIS — Z62810 Personal history of physical and sexual abuse in childhood: Secondary | ICD-10-CM

## 2019-02-16 DIAGNOSIS — Z6828 Body mass index (BMI) 28.0-28.9, adult: Secondary | ICD-10-CM

## 2019-02-16 DIAGNOSIS — F1410 Cocaine abuse, uncomplicated: Secondary | ICD-10-CM

## 2019-09-28 ENCOUNTER — Other Ambulatory Visit: Payer: Self-pay

## 2019-09-28 ENCOUNTER — Ambulatory Visit (LOCAL_COMMUNITY_HEALTH_CENTER): Payer: Medicaid Other

## 2019-09-28 VITALS — BP 133/79 | Ht 68.0 in | Wt 215.0 lb

## 2019-09-28 DIAGNOSIS — Z3201 Encounter for pregnancy test, result positive: Secondary | ICD-10-CM

## 2019-09-28 LAB — PREGNANCY, URINE: Preg Test, Ur: POSITIVE — AB

## 2019-09-28 NOTE — Progress Notes (Signed)
Pt plans care at [facility]; sent to preadmit. Pt declines PNV today, plans to buy Gummy PNV asap.

## 2019-10-19 NOTE — Progress Notes (Signed)
Chart abstracted per 10/18/19 phone interview Tawanna Solo, RN; Debera Lat, RN

## 2019-10-26 ENCOUNTER — Other Ambulatory Visit: Payer: Self-pay

## 2019-10-26 ENCOUNTER — Ambulatory Visit: Payer: Medicaid Other | Admitting: Family Medicine

## 2019-10-26 DIAGNOSIS — O99210 Obesity complicating pregnancy, unspecified trimester: Secondary | ICD-10-CM

## 2019-10-26 DIAGNOSIS — F1410 Cocaine abuse, uncomplicated: Secondary | ICD-10-CM

## 2019-10-26 DIAGNOSIS — O0990 Supervision of high risk pregnancy, unspecified, unspecified trimester: Secondary | ICD-10-CM | POA: Diagnosis not present

## 2019-10-26 DIAGNOSIS — R7989 Other specified abnormal findings of blood chemistry: Secondary | ICD-10-CM | POA: Diagnosis not present

## 2019-10-26 DIAGNOSIS — O99211 Obesity complicating pregnancy, first trimester: Secondary | ICD-10-CM | POA: Insufficient documentation

## 2019-10-26 LAB — URINALYSIS
Bilirubin, UA: NEGATIVE
Glucose, UA: NEGATIVE
Ketones, UA: NEGATIVE
Nitrite, UA: NEGATIVE
Protein,UA: NEGATIVE
RBC, UA: NEGATIVE
Specific Gravity, UA: 1.025 (ref 1.005–1.030)
Urobilinogen, Ur: 1 mg/dL (ref 0.2–1.0)
pH, UA: 7 (ref 5.0–7.5)

## 2019-10-26 LAB — WET PREP FOR TRICH, YEAST, CLUE
Trichomonas Exam: NEGATIVE
Yeast Exam: NEGATIVE

## 2019-10-26 LAB — HEMOGLOBIN, FINGERSTICK: Hemoglobin: 12.4 g/dL (ref 11.1–15.9)

## 2019-10-26 NOTE — Progress Notes (Signed)
Pt declines influenza vaccine today. Wet mount, Hgb and Urine dip reviewed, no tx per standing orders. [facility] referral for 1st Trimester screen faxed, confirmation received. Provider orders completed.
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
## 2019-10-27 ENCOUNTER — Other Ambulatory Visit: Payer: Self-pay | Admitting: Family Medicine

## 2019-10-27 DIAGNOSIS — Z369 Encounter for antenatal screening, unspecified: Secondary | ICD-10-CM

## 2019-10-27 LAB — CBC/D/PLT+RPR+RH+ABO+AB SCR
Antibody Screen: NEGATIVE
Basophils Absolute: 0 10*3/uL (ref 0.0–0.2)
Basos: 0 %
EOS (ABSOLUTE): 0 10*3/uL (ref 0.0–0.4)
Eos: 0 %
Hematocrit: 37.2 % (ref 34.0–46.6)
Hemoglobin: 12.3 g/dL (ref 11.1–15.9)
Hepatitis B Surface Ag: NEGATIVE
Immature Grans (Abs): 0 10*3/uL (ref 0.0–0.1)
Immature Granulocytes: 0 %
Lymphocytes Absolute: 1.4 10*3/uL (ref 0.7–3.1)
Lymphs: 18 %
MCH: 26.5 pg — ABNORMAL LOW (ref 26.6–33.0)
MCHC: 33.1 g/dL (ref 31.5–35.7)
MCV: 80 fL (ref 79–97)
Monocytes Absolute: 0.4 10*3/uL (ref 0.1–0.9)
Monocytes: 5 %
Neutrophils Absolute: 5.9 10*3/uL (ref 1.4–7.0)
Neutrophils: 77 %
Platelets: 179 10*3/uL (ref 150–450)
RBC: 4.65 x10E6/uL (ref 3.77–5.28)
RDW: 13 % (ref 11.7–15.4)
RPR Ser Ql: NONREACTIVE
Rh Factor: POSITIVE
WBC: 7.8 10*3/uL (ref 3.4–10.8)

## 2019-10-27 LAB — COMPREHENSIVE METABOLIC PANEL
ALT: 18 IU/L (ref 0–32)
AST: 17 IU/L (ref 0–40)
Albumin/Globulin Ratio: 1.8 (ref 1.2–2.2)
Albumin: 4.3 g/dL (ref 3.9–5.0)
Alkaline Phosphatase: 71 IU/L (ref 39–117)
BUN/Creatinine Ratio: 13 (ref 9–23)
BUN: 6 mg/dL (ref 6–20)
Bilirubin Total: 0.2 mg/dL (ref 0.0–1.2)
CO2: 21 mmol/L (ref 20–29)
Calcium: 8.9 mg/dL (ref 8.7–10.2)
Chloride: 103 mmol/L (ref 96–106)
Creatinine, Ser: 0.45 mg/dL — ABNORMAL LOW (ref 0.57–1.00)
GFR calc Af Amer: 160 mL/min/{1.73_m2} (ref >59)
GFR calc non Af Amer: 139 mL/min/{1.73_m2} (ref >59)
Globulin, Total: 2.4 g/dL (ref 1.5–4.5)
Glucose: 100 mg/dL — ABNORMAL HIGH (ref 65–99)
Potassium: 3.8 mmol/L (ref 3.5–5.2)
Sodium: 138 mmol/L (ref 134–144)
Total Protein: 6.7 g/dL (ref 6.0–8.5)

## 2019-10-27 LAB — PROTEIN / CREATININE RATIO, URINE
Creatinine, Urine: 140.1 mg/dL
Protein, Ur: 11.7 mg/dL
Protein/Creat Ratio: 84 mg/g creat (ref 0–200)

## 2019-10-27 LAB — 789231 7+OXYCODONE-BUND
Amphetamines, Urine: NEGATIVE ng/mL
BENZODIAZ UR QL: NEGATIVE ng/mL
Barbiturate screen, urine: NEGATIVE ng/mL
Cannabinoid Quant, Ur: NEGATIVE ng/mL
Cocaine (Metab.): NEGATIVE ng/mL
OPIATE SCREEN URINE: NEGATIVE ng/mL
Oxycodone/Oxymorphone, Urine: NEGATIVE ng/mL
PCP Quant, Ur: NEGATIVE ng/mL

## 2019-10-27 LAB — VARICELLA ZOSTER ANTIBODY, IGG: Varicella zoster IgG: 2090 index (ref >165)

## 2019-10-27 LAB — HGB A1C W/O EAG: Hgb A1c MFr Bld: 4.9 % (ref 4.8–5.6)

## 2019-10-27 LAB — TSH: TSH: 0.419 u[IU]/mL — ABNORMAL LOW (ref 0.450–4.500)

## 2019-10-27 LAB — GLUCOSE, 1 HOUR GESTATIONAL: Gestational Diabetes Screen: 93 mg/dL (ref 65–139)

## 2019-10-28 LAB — CHLAMYDIA/GC NAA, CONFIRMATION
Chlamydia trachomatis, NAA: NEGATIVE
Neisseria gonorrhoeae, NAA: NEGATIVE

## 2019-10-28 LAB — URINE CULTURE: Organism ID, Bacteria: NO GROWTH

## 2019-10-28 NOTE — L&D Delivery Note (Signed)
Delivery Note  Patient was induced at 41 weeks, starting with cytotec dosing for cervical ripening and then pitocin infusion per protocol.  She made progress to complete dilation having received an epidural at 4 cms.  At 1658 a viable female was delivered vaginally . The infant presented OA, and with effort,  a tight nuchal cord was reduced prior to the shoulder delivery. Infant restitution to LOT.  APGAR: ,6 ;8 weight  .8lbs 13 oz  Placenta status:  ,intact and complete; spontaneous delivery  .  Cord:3 vessel   with the following complications:  none.   Anesthesia:  epidural Episiotomy:   None  Lacerations:  none Suture Repair: NA Est. Blood Loss (mL):  100  Mom to postpartum.  Baby to Couplet care / Skin to Skin.  Mirna Mires 05/07/2020, 5:33 PM

## 2019-10-31 ENCOUNTER — Telehealth: Payer: Self-pay

## 2019-10-31 NOTE — Telephone Encounter (Signed)
TC to patient to inform her of DP 1st trimester screen appointment on 11/07/19. GC at 1:00 and U/S at 2:00pm. Patient states she had a call from [facility] and will receive GC at 10:00am on 11/07/19. Patient denies questions at this time.Burt Knack, RN

## 2019-11-01 ENCOUNTER — Encounter: Payer: Self-pay | Admitting: Family Medicine

## 2019-11-01 DIAGNOSIS — R7989 Other specified abnormal findings of blood chemistry: Secondary | ICD-10-CM | POA: Insufficient documentation

## 2019-11-03 LAB — SPECIMEN STATUS REPORT

## 2019-11-03 LAB — T3: T3, Total: 200 ng/dL — ABNORMAL HIGH (ref 71–180)

## 2019-11-03 LAB — TSH+FREE T4
Free T4: 1.17 ng/dL (ref 0.82–1.77)
TSH: 0.435 u[IU]/mL — ABNORMAL LOW (ref 0.450–4.500)

## 2019-11-07 ENCOUNTER — Other Ambulatory Visit: Payer: Self-pay | Admitting: Family Medicine

## 2019-11-07 ENCOUNTER — Ambulatory Visit (HOSPITAL_BASED_OUTPATIENT_CLINIC_OR_DEPARTMENT_OTHER)
Admission: RE | Admit: 2019-11-07 | Discharge: 2019-11-07 | Disposition: A | Discharge status: Home / Self Care | Payer: Medicaid Other | Source: Ambulatory Visit | Attending: Maternal & Fetal Medicine | Admitting: Maternal & Fetal Medicine

## 2019-11-07 ENCOUNTER — Other Ambulatory Visit: Payer: Self-pay

## 2019-11-07 ENCOUNTER — Ambulatory Visit
Admission: RE | Admit: 2019-11-07 | Discharge: 2019-11-07 | Disposition: A | Discharge status: Home / Self Care | Payer: Medicaid Other | Source: Ambulatory Visit | Attending: Maternal & Fetal Medicine | Admitting: Maternal & Fetal Medicine

## 2019-11-07 DIAGNOSIS — O99210 Obesity complicating pregnancy, unspecified trimester: Secondary | ICD-10-CM

## 2019-11-07 DIAGNOSIS — Z369 Encounter for antenatal screening, unspecified: Secondary | ICD-10-CM

## 2019-11-07 DIAGNOSIS — Z36 Encounter for antenatal screening for chromosomal anomalies: Secondary | ICD-10-CM | POA: Diagnosis not present

## 2019-11-07 DIAGNOSIS — O0990 Supervision of high risk pregnancy, unspecified, unspecified trimester: Secondary | ICD-10-CM

## 2019-11-07 DIAGNOSIS — Z3A15 15 weeks gestation of pregnancy: Secondary | ICD-10-CM | POA: Insufficient documentation

## 2019-11-07 NOTE — Progress Notes (Signed)
Virtual Visit via Telephone Note  I connected with Charlene Smith on November 07, 2019 at 10:00 AM EST by telephone and verified that I am speaking with the correct person using two identifiers.  Referring physician:  [facility] Length of Consultation:  25 minutes  [name]  was referred to [facility] [place] for genetic counseling to review prenatal screening and testing options.  This note summarizes the information we discussed.    We offered the following routine screening tests for this pregnancy:  The most accurate screening option for chromosome conditions is cell free fetal DNA testing.  Though this is typically reserved for pregnancies at increased risk for aneuploidy, it is currently being made available and many insurance companies are adding coverage for this testing in low risk patients during COVID.  This test utilizes a maternal blood sample and DNA sequencing technology to isolate circulating cell free fetal DNA from maternal plasma.  The fetal DNA can then be analyzed for DNA sequences that are derived from the three most common chromosomes involved in aneuploidy, chromosomes 13, 18, and 21.  If the overall amount of DNA is greater than the expected level for any of these chromosomes, aneuploidy is suspected.  The detection rates are >99% for Down syndrome, >98% for trisomy 18 and >91% for Trisomy 13.  While we do not consider it a replacement for invasive testing and karyotype analysis, a negative result from this testing would be reassuring, though not a guarantee of a normal chromosome complement for the baby.  An abnormal result may be suggestive of an abnormal chromosome complement, though we would still recommend CVS or amniocentesis to confirm any findings from this testing.  First trimester screening, which includes nuchal translucency ultrasound screen and first trimester maternal serum marker screening, is the test that has most recently been  available for low risk patients.  The nuchal translucency has approximately an 80% detection rate for Down syndrome and can be positive for other chromosome abnormalities as well as congenital heart defects.  When combined with a maternal serum marker screening, the detection rate is up to 90% for Down syndrome and up to 97% for trisomy 18.   Given current recommendations during COVID, we are offering only the biochemical testing portion of this testing (without the ultrasound and NT portion), which has a much lower detection rate.  Maternal serum marker screening, or "quad" screen, is a blood test that measures pregnancy proteins, can provide risk assessments for Down syndrome, trisomy 18, and open neural tube defects (spina bifida, anencephaly). Because it does not directly examine the fetus, it cannot positively diagnose or rule out these problems. This is a second trimester option which could be offered along with the anatomy ultrasound. It can detect approximately 75% of babies with Down syndrome, 80% of babies with open spina bifida and 70% of babies with trisomy 44.  Targeted ultrasound uses high frequency sound waves to create an image of the developing fetus.  An ultrasound is often recommended as a routine means of evaluating the pregnancy.  It is also used to screen for fetal anatomy problems (for example, a heart defect) that might be suggestive of a chromosomal or other abnormality. We are currently not recommending a first trimester ultrasound other than that which would be ordered for dating and viability.  Should these screening tests indicate an increased concern, then the following additional testing options would be offered:  The chorionic villus sampling procedure is available for  first trimester chromosome analysis.  This involves the withdrawal of a small amount of chorionic villi (tissue from the developing placenta).  Risk of pregnancy loss is estimated to be approximately 1 in 200 to  1 in 100 (0.5 to 1%).  There is approximately a 1% (1 in 100) chance that the CVS chromosome results will be unclear.  Chorionic villi cannot be tested for neural tube defects.     Amniocentesis involves the removal of a small amount of amniotic fluid from the sac surrounding the fetus with the use of a thin needle inserted through the maternal abdomen and uterus.  Ultrasound guidance is used throughout the procedure.  Fetal cells from amniotic fluid are directly evaluated and > 99.5% of chromosome problems and > 98% of open neural tube defects can be detected. This procedure is generally performed after the 15th week of pregnancy.  The main risks to this procedure include complications leading to miscarriage in less than 1 in 200 cases (0.5%).  Cystic Fibrosis and Spinal Muscular Atrophy (SMA) screening were also discussed with the patient. Both conditions are recessive, which means that both parents must be carriers in order to have a child with the disease.  Cystic fibrosis (CF) is one of the most common genetic conditions in persons of Caucasian ancestry.  This condition occurs in approximately 1 in 2,500 Caucasian persons and results in thickened secretions in the lungs, digestive, and reproductive systems.  For a baby to be at risk for having CF, both of the parents must be carriers for this condition.  Approximately 1 in 79 Caucasian persons is a carrier for CF.  Current carrier testing looks for the most common mutations in the gene for CF and can detect approximately 90% of carriers in the Caucasian population.  This means that the carrier screening can greatly reduce, but cannot eliminate, the chance for an individual to have a child with CF.  If an individual is found to be a carrier for CF, then carrier testing would be available for the partner. As part of [place]'s newborn screening profile, all babies born in the state of [place] will have a two-tier screening process.  Specimens are  first tested to determine the concentration of immunoreactive trypsinogen (IRT).  The top 5% of specimens with the highest IRT values then undergo DNA testing using a panel of over 40 common CF mutations. SMA is a neurodegenerative disorder that leads to atrophy of skeletal muscle and overall weakness.  This condition is also more prevalent in the Caucasian population, with 1 in 40-1 in 60 persons being a carrier and 1 in 6,000-1 in 10,000 children being affected.  There are multiple forms of the disease, with some causing death in infancy to other forms with survival into adulthood.  The genetics of SMA is complex, but carrier screening can detect up to 95% of carriers in the Caucasian population.  Similar to CF, a negative result can greatly reduce, but cannot eliminate, the chance to have a child with SMA. The patient declined carrier screening for CF and SMA.  We talked about the option of signing up for Early Check to have the baby tested for SMA after delivery as part of a new study in [place].  This registration can be done online prior to delivery if desired.  We obtained a detailed family history and pregnancy history. This is the third pregnancy for this couple. They have a healthy 69 year old son and had one early miscarriage.  In the current pregnancy, she reported no complications or exposure to medications, recreational drugs, tobacco or alcohol.  She stated that the father of the baby has a nephew with autism.  We reviewed that autism may occur for various reasons which are often not well understood.  It may be present as an isolated finding or be present along with other health or developmental differences as part of several genetic syndromes.  Without a specific known cause, it is most often thought to be due to multifactorial inheritance and would be expected to have a low chance in a third degree relative. We talked about the option of carrier screening for Fragile X, but given the history (his  brother's son) of being related to this pregnancy only through males, it would not be a concern.  The remainder of the family history was reported to be unremarkable for birth defects, intellectual delays, recurrent pregnancy loss or known chromosome abnormalities.  After consideration of the options, Ms. Marrietta Thunder elected to have [place] with SCA drawn at [facility] at her ultrasound visit on 11/07/19.  The patient declined carrier testing for CF, SMA and hemoglobinopathies.  The patient was encouraged to call with questions or concerns.  We can be contacted at [phone].  Labs ordered: MaterniT21 PLUS with SCA  Wilburt Finlay, MS, CGC  I provided 25 minutes of non-face-to-face time during this encounter.   Donette Larry

## 2019-11-08 ENCOUNTER — Telehealth: Payer: Self-pay | Admitting: Dietician

## 2019-11-08 LAB — HM HIV SCREENING LAB: HM HIV Screening: NEGATIVE

## 2019-11-13 LAB — MATERNIT21 PLUS CORE+SCA
Fetal Fraction: 14
Monosomy X (Turner Syndrome): NOT DETECTED
Result (T21): NEGATIVE
Trisomy 13 (Patau syndrome): NEGATIVE
Trisomy 18 (Edwards syndrome): NEGATIVE
Trisomy 21 (Down syndrome): NEGATIVE
XXX (Triple X Syndrome): NOT DETECTED
XXY (Klinefelter Syndrome): NOT DETECTED
XYY (Jacobs Syndrome): NOT DETECTED

## 2019-11-16 ENCOUNTER — Other Ambulatory Visit: Payer: Self-pay | Admitting: Family Medicine

## 2019-11-16 DIAGNOSIS — O0990 Supervision of high risk pregnancy, unspecified, unspecified trimester: Secondary | ICD-10-CM

## 2019-11-17 ENCOUNTER — Telehealth: Payer: Self-pay | Admitting: Obstetrics and Gynecology

## 2019-11-17 NOTE — Telephone Encounter (Signed)
The patient was informed of the results of her recent MaterniT21 testing which yielded NEGATIVE results.  The patient's specimen showed DNA consistent with two copies of chromosomes 21, 18 and 13.  The sensitivity for trisomy 21, trisomy 18 and trisomy 13 using this testing are reported as 99.1%, 99.9% and 91.7% respectively.  Thus, while the results of this testing are highly accurate, they are not considered diagnostic at this time.  Should more definitive information be desired, the patient may still consider amniocentesis.   As requested to know by the patient, sex chromosome analysis was included for this sample.  Results are consistent with a female fetus. This is predicted with >99% accuracy.  A maternal serum AFP only should be considered if screening for neural tube defects is desired.  We may be reached at [phone] with any questions or concerns.   Latrel Szymczak F. Angelee Bahr, MS, CGC   

## 2019-11-21 NOTE — Addendum Note (Signed)
Addended by: Heywood Bene on: 11/21/2019 11:18 AM   Modules accepted: Orders

## 2019-11-23 ENCOUNTER — Ambulatory Visit: Payer: Self-pay

## 2019-11-28 ENCOUNTER — Other Ambulatory Visit: Payer: Self-pay

## 2019-11-28 DIAGNOSIS — Z3A19 19 weeks gestation of pregnancy: Secondary | ICD-10-CM

## 2019-12-01 ENCOUNTER — Other Ambulatory Visit: Payer: Self-pay | Admitting: Maternal & Fetal Medicine

## 2019-12-01 ENCOUNTER — Telehealth: Payer: Self-pay

## 2019-12-01 ENCOUNTER — Other Ambulatory Visit: Payer: Self-pay

## 2019-12-01 DIAGNOSIS — Z0489 Encounter for examination and observation for other specified reasons: Secondary | ICD-10-CM

## 2019-12-01 DIAGNOSIS — Z3A19 19 weeks gestation of pregnancy: Secondary | ICD-10-CM

## 2019-12-01 DIAGNOSIS — IMO0002 Reserved for concepts with insufficient information to code with codable children: Secondary | ICD-10-CM

## 2019-12-01 NOTE — Telephone Encounter (Signed)
DNKA 11/23/19-attempted to reschedule; no answer, left voicemail message Sharlette Dense, RN

## 2019-12-05 ENCOUNTER — Other Ambulatory Visit: Payer: Self-pay

## 2019-12-05 ENCOUNTER — Ambulatory Visit
Admission: RE | Admit: 2019-12-05 | Discharge: 2019-12-05 | Disposition: A | Discharge status: Home / Self Care | Payer: Medicaid Other | Source: Ambulatory Visit | Attending: Obstetrics and Gynecology | Admitting: Obstetrics and Gynecology

## 2019-12-05 DIAGNOSIS — IMO0002 Reserved for concepts with insufficient information to code with codable children: Secondary | ICD-10-CM

## 2019-12-05 DIAGNOSIS — O99210 Obesity complicating pregnancy, unspecified trimester: Secondary | ICD-10-CM

## 2019-12-05 DIAGNOSIS — Z3A19 19 weeks gestation of pregnancy: Secondary | ICD-10-CM | POA: Diagnosis not present

## 2019-12-05 DIAGNOSIS — Z0489 Encounter for examination and observation for other specified reasons: Secondary | ICD-10-CM | POA: Insufficient documentation

## 2019-12-05 DIAGNOSIS — O0990 Supervision of high risk pregnancy, unspecified, unspecified trimester: Secondary | ICD-10-CM

## 2019-12-07 ENCOUNTER — Ambulatory Visit: Payer: Self-pay

## 2019-12-27 ENCOUNTER — Telehealth: Payer: Self-pay

## 2019-12-27 NOTE — Telephone Encounter (Signed)
Attempted to call to schedule f/u appt.; no answer, left voicemail message Sharlette Dense, RN

## 2020-01-18 ENCOUNTER — Encounter: Payer: Self-pay | Admitting: Advanced Practice Midwife

## 2020-01-18 ENCOUNTER — Other Ambulatory Visit: Payer: Self-pay

## 2020-01-18 ENCOUNTER — Ambulatory Visit (INDEPENDENT_AMBULATORY_CARE_PROVIDER_SITE_OTHER): Payer: Medicaid Other | Admitting: Advanced Practice Midwife

## 2020-01-18 VITALS — BP 115/73 | Wt 227.0 lb

## 2020-01-18 DIAGNOSIS — O99280 Endocrine, nutritional and metabolic diseases complicating pregnancy, unspecified trimester: Secondary | ICD-10-CM

## 2020-01-18 DIAGNOSIS — E0590 Thyrotoxicosis, unspecified without thyrotoxic crisis or storm: Secondary | ICD-10-CM | POA: Diagnosis not present

## 2020-01-18 DIAGNOSIS — Z130 Encounter for screening for diseases of the blood and blood-forming organs and certain disorders involving the immune mechanism: Secondary | ICD-10-CM

## 2020-01-18 DIAGNOSIS — Z131 Encounter for screening for diabetes mellitus: Secondary | ICD-10-CM

## 2020-01-18 DIAGNOSIS — Z3A25 25 weeks gestation of pregnancy: Secondary | ICD-10-CM

## 2020-01-18 DIAGNOSIS — Z113 Encounter for screening for infections with a predominantly sexual mode of transmission: Secondary | ICD-10-CM

## 2020-01-18 DIAGNOSIS — O99282 Endocrine, nutritional and metabolic diseases complicating pregnancy, second trimester: Secondary | ICD-10-CM | POA: Diagnosis not present

## 2020-01-18 DIAGNOSIS — O0992 Supervision of high risk pregnancy, unspecified, second trimester: Secondary | ICD-10-CM | POA: Diagnosis not present

## 2020-01-18 DIAGNOSIS — O99212 Obesity complicating pregnancy, second trimester: Secondary | ICD-10-CM | POA: Diagnosis not present

## 2020-01-18 DIAGNOSIS — O0990 Supervision of high risk pregnancy, unspecified, unspecified trimester: Secondary | ICD-10-CM

## 2020-01-18 DIAGNOSIS — O99210 Obesity complicating pregnancy, unspecified trimester: Secondary | ICD-10-CM

## 2020-01-18 LAB — POCT URINALYSIS DIPSTICK OB
Glucose, UA: NEGATIVE
POC,PROTEIN,UA: NEGATIVE

## 2020-01-18 NOTE — Progress Notes (Signed)
NOB Transfer from [facility] 
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
## 2020-01-18 NOTE — Patient Instructions (Signed)
Breastfeeding  Choosing to breastfeed is one of the best decisions you can make for yourself and your baby. A change in hormones during pregnancy causes your breasts to make breast milk in your milk-producing glands. Hormones prevent breast milk from being released before your baby is born. They also prompt milk flow after birth. Once breastfeeding has begun, thoughts of your baby, as well as his or her sucking or crying, can stimulate the release of milk from your milk-producing glands. Benefits of breastfeeding Research shows that breastfeeding offers many health benefits for infants and mothers. It also offers a cost-free and convenient way to feed your baby. For your baby  Your first milk (colostrum) helps your baby's digestive system to function better.  Special cells in your milk (antibodies) help your baby to fight off infections.  Breastfed babies are less likely to develop asthma, allergies, obesity, or type 2 diabetes. They are also at lower risk for sudden infant death syndrome (SIDS).  Nutrients in breast milk are better able to meet your baby's needs compared to infant formula.  Breast milk improves your baby's brain development. For you  Breastfeeding helps to create a very special bond between you and your baby.  Breastfeeding is convenient. Breast milk costs nothing and is always available at the correct temperature.  Breastfeeding helps to burn calories. It helps you to lose the weight that you gained during pregnancy.  Breastfeeding makes your uterus return faster to its size before pregnancy. It also slows bleeding (lochia) after you give birth.  Breastfeeding helps to lower your risk of developing type 2 diabetes, osteoporosis, rheumatoid arthritis, cardiovascular disease, and breast, ovarian, uterine, and endometrial cancer later in life. Breastfeeding basics Starting breastfeeding  Find a comfortable place to sit or lie down, with your neck and back  well-supported.  Place a pillow or a rolled-up blanket under your baby to bring him or her to the level of your breast (if you are seated). Nursing pillows are specially designed to help support your arms and your baby while you breastfeed.  Make sure that your baby's tummy (abdomen) is facing your abdomen.  Gently massage your breast. With your fingertips, massage from the outer edges of your breast inward toward the nipple. This encourages milk flow. If your milk flows slowly, you may need to continue this action during the feeding.  Support your breast with 4 fingers underneath and your thumb above your nipple (make the letter "C" with your hand). Make sure your fingers are well away from your nipple and your baby's mouth.  Stroke your baby's lips gently with your finger or nipple.  When your baby's mouth is open wide enough, quickly bring your baby to your breast, placing your entire nipple and as much of the areola as possible into your baby's mouth. The areola is the colored area around your nipple. ? More areola should be visible above your baby's upper lip than below the lower lip. ? Your baby's lips should be opened and extended outward (flanged) to ensure an adequate, comfortable latch. ? Your baby's tongue should be between his or her lower gum and your breast.  Make sure that your baby's mouth is correctly positioned around your nipple (latched). Your baby's lips should create a seal on your breast and be turned out (everted).  It is common for your baby to suck about 2-3 minutes in order to start the flow of breast milk. Latching Teaching your baby how to latch onto your breast properly is  very important. An improper latch can cause nipple pain, decreased milk supply, and poor weight gain in your baby. Also, if your baby is not latched onto your nipple properly, he or she may swallow some air during feeding. This can make your baby fussy. Burping your baby when you switch breasts  during the feeding can help to get rid of the air. However, teaching your baby to latch on properly is still the best way to prevent fussiness from swallowing air while breastfeeding. Signs that your baby has successfully latched onto your nipple  Silent tugging or silent sucking, without causing you pain. Infant's lips should be extended outward (flanged).  Swallowing heard between every 3-4 sucks once your milk has started to flow (after your let-down milk reflex occurs).  Muscle movement above and in front of his or her ears while sucking. Signs that your baby has not successfully latched onto your nipple  Sucking sounds or smacking sounds from your baby while breastfeeding.  Nipple pain. If you think your baby has not latched on correctly, slip your finger into the corner of your baby's mouth to break the suction and place it between your baby's gums. Attempt to start breastfeeding again. Signs of successful breastfeeding Signs from your baby  Your baby will gradually decrease the number of sucks or will completely stop sucking.  Your baby will fall asleep.  Your baby's body will relax.  Your baby will retain a small amount of milk in his or her mouth.  Your baby will let go of your breast by himself or herself. Signs from you  Breasts that have increased in firmness, weight, and size 1-3 hours after feeding.  Breasts that are softer immediately after breastfeeding.  Increased milk volume, as well as a change in milk consistency and color by the fifth day of breastfeeding.  Nipples that are not sore, cracked, or bleeding. Signs that your baby is getting enough milk  Wetting at least 1-2 diapers during the first 24 hours after birth.  Wetting at least 5-6 diapers every 24 hours for the first week after birth. The urine should be clear or pale yellow by the age of 5 days.  Wetting 6-8 diapers every 24 hours as your baby continues to grow and develop.  At least 3 stools in  a 24-hour period by the age of 5 days. The stool should be soft and yellow.  At least 3 stools in a 24-hour period by the age of 7 days. The stool should be seedy and yellow.  No loss of weight greater than 10% of birth weight during the first 3 days of life.  Average weight gain of 4-7 oz (113-198 g) per week after the age of 4 days.  Consistent daily weight gain by the age of 5 days, without weight loss after the age of 2 weeks. After a feeding, your baby may spit up a small amount of milk. This is normal. Breastfeeding frequency and duration Frequent feeding will help you make more milk and can prevent sore nipples and extremely full breasts (breast engorgement). Breastfeed when you feel the need to reduce the fullness of your breasts or when your baby shows signs of hunger. This is called "breastfeeding on demand." Signs that your baby is hungry include:  Increased alertness, activity, or restlessness.  Movement of the head from side to side.  Opening of the mouth when the corner of the mouth or cheek is stroked (rooting).  Increased sucking sounds, smacking lips, cooing,   sighing, or squeaking.  Hand-to-mouth movements and sucking on fingers or hands.  Fussing or crying. Avoid introducing a pacifier to your baby in the first 4-6 weeks after your baby is born. After this time, you may choose to use a pacifier. Research has shown that pacifier use during the first year of a baby's life decreases the risk of sudden infant death syndrome (SIDS). Allow your baby to feed on each breast as long as he or she wants. When your baby unlatches or falls asleep while feeding from the first breast, offer the second breast. Because newborns are often sleepy in the first few weeks of life, you may need to awaken your baby to get him or her to feed. Breastfeeding times will vary from baby to baby. However, the following rules can serve as a guide to help you make sure that your baby is properly  fed:  Newborns (babies 41 weeks of age or younger) may breastfeed every 1-3 hours.  Newborns should not go without breastfeeding for longer than 3 hours during the day or 5 hours during the night.  You should breastfeed your baby a minimum of 8 times in a 24-hour period. Breast milk pumping     Pumping and storing breast milk allows you to make sure that your baby is exclusively fed your breast milk, even at times when you are unable to breastfeed. This is especially important if you go back to work while you are still breastfeeding, or if you are not able to be present during feedings. Your lactation consultant can help you find a method of pumping that works best for you and give you guidelines about how long it is safe to store breast milk. Caring for your breasts while you breastfeed Nipples can become dry, cracked, and sore while breastfeeding. The following recommendations can help keep your breasts moisturized and healthy:  Avoid using soap on your nipples.  Wear a supportive bra designed especially for nursing. Avoid wearing underwire-style bras or extremely tight bras (sports bras).  Air-dry your nipples for 3-4 minutes after each feeding.  Use only cotton bra pads to absorb leaked breast milk. Leaking of breast milk between feedings is normal.  Use lanolin on your nipples after breastfeeding. Lanolin helps to maintain your skin's normal moisture barrier. Pure lanolin is not harmful (not toxic) to your baby. You may also hand express a few drops of breast milk and gently massage that milk into your nipples and allow the milk to air-dry. In the first few weeks after giving birth, some women experience breast engorgement. Engorgement can make your breasts feel heavy, warm, and tender to the touch. Engorgement peaks within 3-5 days after you give birth. The following recommendations can help to ease engorgement:  Completely empty your breasts while breastfeeding or pumping. You may  want to start by applying warm, moist heat (in the shower or with warm, water-soaked hand towels) just before feeding or pumping. This increases circulation and helps the milk flow. If your baby does not completely empty your breasts while breastfeeding, pump any extra milk after he or she is finished.  Apply ice packs to your breasts immediately after breastfeeding or pumping, unless this is too uncomfortable for you. To do this: ? Put ice in a plastic bag. ? Place a towel between your skin and the bag. ? Leave the ice on for 20 minutes, 2-3 times a day.  Make sure that your baby is latched on and positioned properly while breastfeeding. If  engorgement persists after 48 hours of following these recommendations, contact your health care provider or a [employer]. Overall health care recommendations while breastfeeding  Eat 3 healthy meals and 3 snacks every day. Well-nourished mothers who are breastfeeding need an additional 450-500 calories a day. You can meet this requirement by increasing the amount of a balanced diet that you eat.  Drink enough water to keep your urine pale yellow or clear.  Rest often, relax, and continue to take your prenatal vitamins to prevent fatigue, stress, and low vitamin and mineral levels in your body (nutrient deficiencies).  Do not use any products that contain nicotine or tobacco, such as cigarettes and e-cigarettes. Your baby may be harmed by chemicals from cigarettes that pass into breast milk and exposure to secondhand smoke. If you need help quitting, ask your health care provider.  Avoid alcohol.  Do not use illegal drugs or marijuana.  Talk with your health care provider before taking any medicines. These include over-the-counter and prescription medicines as well as vitamins and herbal supplements. Some medicines that may be harmful to your baby can pass through breast milk.  It is possible to become pregnant while breastfeeding. If birth  control is desired, ask your health care provider about options that will be safe while breastfeeding your baby. Where to find more information: [employer]: www.llli.org Contact a health care provider if:  You feel like you want to stop breastfeeding or have become frustrated with breastfeeding.  Your nipples are cracked or bleeding.  Your breasts are red, tender, or warm.  You have: ? Painful breasts or nipples. ? A swollen area on either breast. ? A fever or chills. ? Nausea or vomiting. ? Drainage other than breast milk from your nipples.  Your breasts do not become full before feedings by the fifth day after you give birth.  You feel sad and depressed.  Your baby is: ? Too sleepy to eat well. ? Having trouble sleeping. ? More than 51 week old and wetting fewer than 6 diapers in a 24-hour period. ? Not gaining weight by 57 days of age.  Your baby has fewer than 3 stools in a 24-hour period.  Your baby's skin or the white parts of his or her eyes become yellow. Get help right away if:  Your baby is overly tired (lethargic) and does not want to wake up and feed.  Your baby develops an unexplained fever. Summary  Breastfeeding offers many health benefits for infant and mothers.  Try to breastfeed your infant when he or she shows early signs of hunger.  Gently tickle or stroke your baby's lips with your finger or nipple to allow the baby to open his or her mouth. Bring the baby to your breast. Make sure that much of the areola is in your baby's mouth. Offer one side and burp the baby before you offer the other side.  Talk with your health care provider or lactation consultant if you have questions or you face problems as you breastfeed. This information is not intended to replace advice given to you by your health care provider. Make sure you discuss any questions you have with your health care provider. Document Revised: 01/07/2018 Document Reviewed:  11/14/2016 Elsevier Patient Education  2020 [employer]. Second Trimester of Pregnancy The second trimester is from week 14 through week 27 (months 4 through 6). The second trimester is often a time when you feel your best. Your body has adjusted to being  pregnant, and you begin to feel better physically. Usually, morning sickness has lessened or quit completely, you may have more energy, and you may have an increase in appetite. The second trimester is also a time when the fetus is growing rapidly. At the end of the sixth month, the fetus is about 9 inches long and weighs about 1 pounds. You will likely begin to feel the baby move (quickening) between 16 and 20 weeks of pregnancy. Body changes during your second trimester Your body continues to go through many changes during your second trimester. The changes vary from woman to woman.  Your weight will continue to increase. You will notice your lower abdomen bulging out.  You may begin to get stretch marks on your hips, abdomen, and breasts.  You may develop headaches that can be relieved by medicines. The medicines should be approved by your health care provider.  You may urinate more often because the fetus is pressing on your bladder.  You may develop or continue to have heartburn as a result of your pregnancy.  You may develop constipation because certain hormones are causing the muscles that push waste through your intestines to slow down.  You may develop hemorrhoids or swollen, bulging veins (varicose veins).  You may have back pain. This is caused by: ? Weight gain. ? Pregnancy hormones that are relaxing the joints in your pelvis. ? A shift in weight and the muscles that support your balance.  Your breasts will continue to grow and they will continue to become tender.  Your gums may bleed and may be sensitive to brushing and flossing.  Dark spots or blotches (chloasma, mask of pregnancy) may develop on your face. This will  likely fade after the baby is born.  A dark line from your belly button to the pubic area (linea nigra) may appear. This will likely fade after the baby is born.  You may have changes in your hair. These can include thickening of your hair, rapid growth, and changes in texture. Some women also have hair loss during or after pregnancy, or hair that feels dry or thin. Your hair will most likely return to normal after your baby is born. What to expect at prenatal visits During a routine prenatal visit:  You will be weighed to make sure you and the fetus are growing normally.  Your blood pressure will be taken.  Your abdomen will be measured to track your baby's growth.  The fetal heartbeat will be listened to.  Any test results from the previous visit will be discussed. Your health care provider may ask you:  How you are feeling.  If you are feeling the baby move.  If you have had any abnormal symptoms, such as leaking fluid, bleeding, severe headaches, or abdominal cramping.  If you are using any tobacco products, including cigarettes, chewing tobacco, and electronic cigarettes.  If you have any questions. Other tests that may be performed during your second trimester include:  Blood tests that check for: ? Low iron levels (anemia). ? High blood sugar that affects pregnant women (gestational diabetes) between 39 and 28 weeks. ? Rh antibodies. This is to check for a protein on red blood cells (Rh factor).  Urine tests to check for infections, diabetes, or protein in the urine.  An ultrasound to confirm the proper growth and development of the baby.  An amniocentesis to check for possible genetic problems.  Fetal screens for spina bifida and Down syndrome.  HIV (human immunodeficiency  virus) testing. Routine prenatal testing includes screening for HIV, unless you choose not to have this test. Follow these instructions at home: Medicines  Follow your health care provider's  instructions regarding medicine use. Specific medicines may be either safe or unsafe to take during pregnancy.  Take a prenatal vitamin that contains at least 600 micrograms (mcg) of folic acid.  If you develop constipation, try taking a stool softener if your health care provider approves. Eating and drinking   Eat a balanced diet that includes fresh fruits and vegetables, whole grains, good sources of protein such as meat, eggs, or tofu, and low-fat dairy. Your health care provider will help you determine the amount of weight gain that is right for you.  Avoid raw meat and uncooked cheese. These carry germs that can cause birth defects in the baby.  If you have low calcium intake from food, talk to your health care provider about whether you should take a daily calcium supplement.  Limit foods that are high in fat and processed sugars, such as fried and sweet foods.  To prevent constipation: ? Drink enough fluid to keep your urine clear or pale yellow. ? Eat foods that are high in fiber, such as fresh fruits and vegetables, whole grains, and beans. Activity  Exercise only as directed by your health care provider. Most women can continue their usual exercise routine during pregnancy. Try to exercise for 30 minutes at least 5 days a week. Stop exercising if you experience uterine contractions.  Avoid heavy lifting, wear low heel shoes, and practice good posture.  A sexual relationship may be continued unless your health care provider directs you otherwise. Relieving pain and discomfort  Wear a good support bra to prevent discomfort from breast tenderness.  Take warm sitz baths to soothe any pain or discomfort caused by hemorrhoids. Use hemorrhoid cream if your health care provider approves.  Rest with your legs elevated if you have leg cramps or low back pain.  If you develop varicose veins, wear support hose. Elevate your feet for 15 minutes, 3-4 times a day. Limit salt in your  diet. Prenatal Care  Write down your questions. Take them to your prenatal visits.  Keep all your prenatal visits as told by your health care provider. This is important. Safety  Wear your seat belt at all times when driving.  Make a list of emergency phone numbers, including numbers for family, friends, the hospital, and police and fire departments. General instructions  Ask your health care provider for a referral to a local prenatal education class. Begin classes no later than the beginning of month 6 of your pregnancy.  Ask for help if you have counseling or nutritional needs during pregnancy. Your health care provider can offer advice or refer you to specialists for help with various needs.  Do not use hot tubs, steam rooms, or saunas.  Do not douche or use tampons or scented sanitary pads.  Do not cross your legs for long periods of time.  Avoid cat litter boxes and soil used by cats. These carry germs that can cause birth defects in the baby and possibly loss of the fetus by miscarriage or stillbirth.  Avoid all smoking, herbs, alcohol, and unprescribed drugs. Chemicals in these products can affect the formation and growth of the baby.  Do not use any products that contain nicotine or tobacco, such as cigarettes and e-cigarettes. If you need help quitting, ask your health care provider.  Visit your dentist if  you have not gone yet during your pregnancy. Use a soft toothbrush to brush your teeth and be gentle when you floss. Contact a health care provider if:  You have dizziness.  You have mild pelvic cramps, pelvic pressure, or nagging pain in the abdominal area.  You have persistent nausea, vomiting, or diarrhea.  You have a bad smelling vaginal discharge.  You have pain when you urinate. Get help right away if:  You have a fever.  You are leaking fluid from your vagina.  You have spotting or bleeding from your vagina.  You have severe abdominal cramping or  pain.  You have rapid weight gain or weight loss.  You have shortness of breath with chest pain.  You notice sudden or extreme swelling of your face, hands, ankles, feet, or legs.  You have not felt your baby move in over an hour.  You have severe headaches that do not go away when you take medicine.  You have vision changes. Summary  The second trimester is from week 14 through week 27 (months 4 through 6). It is also a time when the fetus is growing rapidly.  Your body goes through many changes during pregnancy. The changes vary from woman to woman.  Avoid all smoking, herbs, alcohol, and unprescribed drugs. These chemicals affect the formation and growth your baby.  Do not use any tobacco products, such as cigarettes, chewing tobacco, and e-cigarettes. If you need help quitting, ask your health care provider.  Contact your health care provider if you have any questions. Keep all prenatal visits as told by your health care provider. This is important. This information is not intended to replace advice given to you by your health care provider. Make sure you discuss any questions you have with your health care provider. Document Revised: 02/04/2019 Document Reviewed: 11/18/2016 Elsevier Patient Education  [place].

## 2020-02-09 ENCOUNTER — Encounter: Payer: Medicaid Other | Admitting: Obstetrics and Gynecology

## 2020-03-09 ENCOUNTER — Other Ambulatory Visit: Payer: Self-pay

## 2020-03-09 ENCOUNTER — Ambulatory Visit (INDEPENDENT_AMBULATORY_CARE_PROVIDER_SITE_OTHER): Payer: Medicaid Other | Admitting: Advanced Practice Midwife

## 2020-03-09 ENCOUNTER — Encounter: Payer: Self-pay | Admitting: Advanced Practice Midwife

## 2020-03-09 ENCOUNTER — Other Ambulatory Visit: Payer: Medicaid Other

## 2020-03-09 VITALS — BP 120/80 | Wt 234.0 lb

## 2020-03-09 DIAGNOSIS — Z113 Encounter for screening for infections with a predominantly sexual mode of transmission: Secondary | ICD-10-CM

## 2020-03-09 DIAGNOSIS — E0590 Thyrotoxicosis, unspecified without thyrotoxic crisis or storm: Secondary | ICD-10-CM

## 2020-03-09 DIAGNOSIS — O0990 Supervision of high risk pregnancy, unspecified, unspecified trimester: Secondary | ICD-10-CM

## 2020-03-09 DIAGNOSIS — Z23 Encounter for immunization: Secondary | ICD-10-CM | POA: Diagnosis not present

## 2020-03-09 DIAGNOSIS — O99210 Obesity complicating pregnancy, unspecified trimester: Secondary | ICD-10-CM

## 2020-03-09 DIAGNOSIS — O99280 Endocrine, nutritional and metabolic diseases complicating pregnancy, unspecified trimester: Secondary | ICD-10-CM | POA: Diagnosis not present

## 2020-03-09 DIAGNOSIS — Z130 Encounter for screening for diseases of the blood and blood-forming organs and certain disorders involving the immune mechanism: Secondary | ICD-10-CM | POA: Diagnosis not present

## 2020-03-09 DIAGNOSIS — Z131 Encounter for screening for diabetes mellitus: Secondary | ICD-10-CM

## 2020-03-09 DIAGNOSIS — Z3A32 32 weeks gestation of pregnancy: Secondary | ICD-10-CM

## 2020-03-09 DIAGNOSIS — O0993 Supervision of high risk pregnancy, unspecified, third trimester: Secondary | ICD-10-CM

## 2020-03-09 NOTE — Addendum Note (Signed)
Addended by: Cornelius Moras D on: 03/09/2020 03:30 PM   Modules accepted: Orders
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
## 2020-03-10 LAB — 28 WEEK RH+PANEL
Basophils Absolute: 0 10*3/uL (ref 0.0–0.2)
Basos: 0 %
EOS (ABSOLUTE): 0 10*3/uL (ref 0.0–0.4)
Eos: 0 %
Gestational Diabetes Screen: 132 mg/dL (ref 65–139)
HIV Screen 4th Generation wRfx: NONREACTIVE
Hematocrit: 33.9 % — ABNORMAL LOW (ref 34.0–46.6)
Hemoglobin: 11.2 g/dL (ref 11.1–15.9)
Immature Grans (Abs): 0 10*3/uL (ref 0.0–0.1)
Immature Granulocytes: 1 %
Lymphocytes Absolute: 1.2 10*3/uL (ref 0.7–3.1)
Lymphs: 17 %
MCH: 26 pg — ABNORMAL LOW (ref 26.6–33.0)
MCHC: 33 g/dL (ref 31.5–35.7)
MCV: 79 fL (ref 79–97)
Monocytes Absolute: 0.3 10*3/uL (ref 0.1–0.9)
Monocytes: 4 %
Neutrophils Absolute: 5.8 10*3/uL (ref 1.4–7.0)
Neutrophils: 78 %
Platelets: 143 10*3/uL — ABNORMAL LOW (ref 150–450)
RBC: 4.31 x10E6/uL (ref 3.77–5.28)
RDW: 13.1 % (ref 11.7–15.4)
RPR Ser Ql: NONREACTIVE
WBC: 7.4 10*3/uL (ref 3.4–10.8)

## 2020-03-10 LAB — MEASLES/MUMPS/RUBELLA IMMUNITY
MUMPS ABS, IGG: 90.1 AU/mL (ref >10.9)
RUBEOLA AB, IGG: 203 AU/mL (ref >16.4)
Rubella Antibodies, IGG: 2.88 index (ref >0.99)

## 2020-03-10 LAB — TSH: TSH: 0.819 u[IU]/mL (ref 0.450–4.500)

## 2020-03-22 ENCOUNTER — Encounter: Payer: Self-pay | Admitting: Obstetrics & Gynecology

## 2020-03-22 ENCOUNTER — Ambulatory Visit (INDEPENDENT_AMBULATORY_CARE_PROVIDER_SITE_OTHER): Payer: Medicaid Other | Admitting: Obstetrics & Gynecology

## 2020-03-22 ENCOUNTER — Other Ambulatory Visit: Payer: Self-pay

## 2020-03-22 VITALS — BP 120/80 | Wt 235.0 lb

## 2020-03-22 DIAGNOSIS — Z3A34 34 weeks gestation of pregnancy: Secondary | ICD-10-CM

## 2020-03-22 DIAGNOSIS — O0993 Supervision of high risk pregnancy, unspecified, third trimester: Secondary | ICD-10-CM

## 2020-03-22 DIAGNOSIS — O99210 Obesity complicating pregnancy, unspecified trimester: Secondary | ICD-10-CM

## 2020-03-22 NOTE — Progress Notes (Signed)
  Subjective  Fetal Movement? yes Contractions? no Leaking Fluid? no Vaginal Bleeding? no GERD Objective  BP 120/80   Wt 235 lb (106.6 kg)   LMP 08/10/2019 (Approximate) Comment: normal  BMI 35.73 kg/m  General: NAD Pumonary: no increased work of breathing Abdomen: gravid, non-tender Extremities: no edema Psychiatric: mood appropriate, affect full  Assessment  29 y.o. G3P1011 at [redacted]w[redacted]d by  04/29/2020, by Ultrasound presenting for routine prenatal visit  Plan   Problem List Items Addressed This Visit      Other   Obesity in pregnancy    Other Visit Diagnoses    [redacted] weeks gestation of pregnancy    -  Primary   Supervision of high risk pregnancy in third trimester          Pregnant Problems (from 10/19/19 to present)    Problem Noted Resolved   Supervision of high risk pregnancy, antepartum 10/26/2019 by Ann Held, PA-C No   Overview Addendum 03/09/2020  3:02 PM by Tresea Mall, CNM     Nursing Staff Provider  Office Location  [facility] Dating  15 wk [place]  Language  English Anatomy US    Flu Vaccine  Declined 10/26/19 Genetic Screen  NIPS: neg/female  AFP:    Elects First Screen: US wnl 11/07/19 Quad:    TDaP vaccine   03/09/20 Hgb A1C or  GTT Early: wnl Third trimester   Rhogam     LAB RESULTS   Feeding Plan Breast & Formula Blood Type   O positive  (10/26/2019)  Contraception OCP Antibody   Negative    (10/26/2019)  Circumcision  Rubella    Pediatrician  [place], [place] [facility]   Non-reactive    (10/26/2019)  Support Person Husband Ryan HBsAg   Non-reactive    (10/26/2019)  Prenatal Classes  HIV    @28wk - Doula referral?  Varicella   Immune      (10/26/2019)  BTL Consent  GBS  (For PCN allergy, check sensitivities)        VBAC Consent NA Pap  due 12/2020    Hgb Electro    BP Cuff ordered  CF   Delivery Group  [facility]   [facility] Group            PNV, FMC, PTL precautions discussed     GBS nv         01/2021, MD, Annamarie Major Ob/G[facility], [facility] 03/22/2020  11:34 AM

## 2020-03-22 NOTE — Patient Instructions (Signed)
Braxton Hicks Contractions °Contractions of the uterus can occur throughout pregnancy, but they are not always a sign that you are in labor. You may have practice contractions called Braxton Hicks contractions. These false labor contractions are sometimes confused with true labor. °What are Braxton Hicks contractions? °Braxton Hicks contractions are tightening movements that occur in the muscles of the uterus before labor. Unlike true labor contractions, these contractions do not result in opening (dilation) and thinning of the cervix. Toward the end of pregnancy (32-34 weeks), Braxton Hicks contractions can happen more often and may become stronger. These contractions are sometimes difficult to tell apart from true labor because they can be very uncomfortable. You should not feel embarrassed if you go to the hospital with false labor. °Sometimes, the only way to tell if you are in true labor is for your health care provider to look for changes in the cervix. The health care provider will do a physical exam and may monitor your contractions. If you are not in true labor, the exam should show that your cervix is not dilating and your water has not broken. °If there are no other health problems associated with your pregnancy, it is completely safe for you to be sent home with false labor. You may continue to have Braxton Hicks contractions until you go into true labor. °How to tell the difference between true labor and false labor °True labor °· Contractions last 30-70 seconds. °· Contractions become very regular. °· Discomfort is usually felt in the top of the uterus, and it spreads to the lower abdomen and low back. °· Contractions do not go away with walking. °· Contractions usually become more intense and increase in frequency. °· The cervix dilates and gets thinner. °False labor °· Contractions are usually shorter and not as strong as true labor contractions. °· Contractions are usually irregular. °· Contractions  are often felt in the front of the lower abdomen and in the groin. °· Contractions may go away when you walk around or change positions while lying down. °· Contractions get weaker and are shorter-lasting as time goes on. °· The cervix usually does not dilate or become thin. °Follow these instructions at home: ° °· Take over-the-counter and prescription medicines only as told by your health care provider. °· Keep up with your usual exercises and follow other instructions from your health care provider. °· Eat and drink lightly if you think you are going into labor. °· If Braxton Hicks contractions are making you uncomfortable: °? Change your position from lying down or resting to walking, or change from walking to resting. °? Sit and rest in a tub of warm water. °? Drink enough fluid to keep your urine pale yellow. Dehydration may cause these contractions. °? Do slow and deep breathing several times an hour. °· Keep all follow-up prenatal visits as told by your health care provider. This is important. °Contact a health care provider if: °· You have a fever. °· You have continuous pain in your abdomen. °Get help right away if: °· Your contractions become stronger, more regular, and closer together. °· You have fluid leaking or gushing from your vagina. °· You pass blood-tinged mucus (bloody show). °· You have bleeding from your vagina. °· You have low back pain that you never had before. °· You feel your baby’s head pushing down and causing pelvic pressure. °· Your baby is not moving inside you as much as it used to. °Summary °· Contractions that occur before labor are   called Braxton Hicks contractions, false labor, or practice contractions. °· Braxton Hicks contractions are usually shorter, weaker, farther apart, and less regular than true labor contractions. True labor contractions usually become progressively stronger and regular, and they become more frequent. °· Manage discomfort from Braxton Hicks contractions  by changing position, resting in a warm bath, drinking plenty of water, or practicing deep breathing. °This information is not intended to replace advice given to you by your health care provider. Make sure you discuss any questions you have with your health care provider. °Document Revised: 09/25/2017 Document Reviewed: 02/26/2017 °Elsevier Patient Education © 2020 [employer] ° °

## 2020-04-06 ENCOUNTER — Other Ambulatory Visit: Payer: Self-pay

## 2020-04-06 ENCOUNTER — Ambulatory Visit (INDEPENDENT_AMBULATORY_CARE_PROVIDER_SITE_OTHER): Payer: Medicaid Other | Admitting: Certified Nurse Midwife

## 2020-04-06 ENCOUNTER — Encounter: Payer: Self-pay | Admitting: Certified Nurse Midwife

## 2020-04-06 VITALS — BP 110/80 | Wt 240.0 lb

## 2020-04-06 DIAGNOSIS — Z3A36 36 weeks gestation of pregnancy: Secondary | ICD-10-CM | POA: Diagnosis not present

## 2020-04-06 DIAGNOSIS — D696 Thrombocytopenia, unspecified: Secondary | ICD-10-CM

## 2020-04-06 DIAGNOSIS — Z3685 Encounter for antenatal screening for Streptococcus B: Secondary | ICD-10-CM

## 2020-04-06 DIAGNOSIS — O99119 Other diseases of the blood and blood-forming organs and certain disorders involving the immune mechanism complicating pregnancy, unspecified trimester: Secondary | ICD-10-CM

## 2020-04-06 LAB — POCT URINALYSIS DIPSTICK OB
Glucose, UA: NEGATIVE
POC,PROTEIN,UA: NEGATIVE

## 2020-04-06 NOTE — Progress Notes (Signed)
ROB at 36wk5d: Increased pelvic pressure, no bleeding or leaking water. Having irregular contractions. Baby active.   Exam: BP 110/80, FH 38cm FHT 129   LOT/ cephalic.  Cervix: closed/50%?/ dipping in pelvis-ballotable  A: IUP at 36wk5d  P: GBS done Labor precautions Get platelet count next visit in 1 week.   Farrel Conners, CNM

## 2020-04-06 NOTE — Progress Notes (Signed)
ROB/GBS- vaginal pain/pressure

## 2020-04-09 LAB — CULTURE, BETA STREP (GROUP B ONLY): Strep Gp B Culture: POSITIVE — AB

## 2020-04-12 ENCOUNTER — Telehealth: Payer: Self-pay

## 2020-04-12 NOTE — Telephone Encounter (Signed)
Pt called back; states vag pain makes it hard to walk, get out of a car, stand up; pain is relieved with sitting; no unusual d/c, bleeding; good FM.  Adv since GFM,  no bleeding to keep appt tomorrow.  Labor precautions given.

## 2020-04-12 NOTE — Telephone Encounter (Signed)
Pt calling; 36w; was seen last Friday; is having vaginal pain; what to do?  [phone]  [facility]

## 2020-04-13 ENCOUNTER — Ambulatory Visit (INDEPENDENT_AMBULATORY_CARE_PROVIDER_SITE_OTHER): Payer: Medicaid Other | Admitting: Advanced Practice Midwife

## 2020-04-13 ENCOUNTER — Other Ambulatory Visit: Payer: Self-pay

## 2020-04-13 ENCOUNTER — Encounter: Payer: Self-pay | Admitting: Advanced Practice Midwife

## 2020-04-13 VITALS — BP 118/80 | Wt 241.0 lb

## 2020-04-13 DIAGNOSIS — O0993 Supervision of high risk pregnancy, unspecified, third trimester: Secondary | ICD-10-CM | POA: Diagnosis not present

## 2020-04-13 DIAGNOSIS — O99119 Other diseases of the blood and blood-forming organs and certain disorders involving the immune mechanism complicating pregnancy, unspecified trimester: Secondary | ICD-10-CM

## 2020-04-13 DIAGNOSIS — D696 Thrombocytopenia, unspecified: Secondary | ICD-10-CM

## 2020-04-13 DIAGNOSIS — Z3A37 37 weeks gestation of pregnancy: Secondary | ICD-10-CM

## 2020-04-13 DIAGNOSIS — O99210 Obesity complicating pregnancy, unspecified trimester: Secondary | ICD-10-CM
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
## 2020-04-14 LAB — PLATELET COUNT: Platelets: 157 10*3/uL (ref 150–450)

## 2020-04-20 ENCOUNTER — Other Ambulatory Visit: Payer: Self-pay

## 2020-04-20 ENCOUNTER — Ambulatory Visit (INDEPENDENT_AMBULATORY_CARE_PROVIDER_SITE_OTHER): Payer: Medicaid Other | Admitting: Obstetrics

## 2020-04-20 VITALS — BP 120/80 | Wt 245.0 lb

## 2020-04-20 DIAGNOSIS — Z3A38 38 weeks gestation of pregnancy: Secondary | ICD-10-CM

## 2020-04-20 DIAGNOSIS — O0993 Supervision of high risk pregnancy, unspecified, third trimester: Secondary | ICD-10-CM

## 2020-04-20 LAB — POCT URINALYSIS DIPSTICK OB
Glucose, UA: NEGATIVE
POC,PROTEIN,UA: NEGATIVE

## 2020-04-20 NOTE — Progress Notes (Signed)
Here for ROB at 38+ weeks.  Denies any danger sxs: no LOF, vaginal bleeding or decreased fetal movement. Ample fetal movement. GBS is positive. O: see VS and fhts. 140s today. Audible accels. A:IUP 38 weeks5days     GBS positive. P: she declines a vag exam today.     RTC in 1 week for ROB and VE.  Mirna Mires, CNM  04/20/2020 3:53 PM

## 2020-04-27 ENCOUNTER — Ambulatory Visit (INDEPENDENT_AMBULATORY_CARE_PROVIDER_SITE_OTHER): Payer: Medicaid Other | Admitting: Obstetrics

## 2020-04-27 ENCOUNTER — Other Ambulatory Visit: Payer: Self-pay

## 2020-04-27 VITALS — BP 120/80 | Wt 244.0 lb

## 2020-04-27 DIAGNOSIS — Z3A39 39 weeks gestation of pregnancy: Secondary | ICD-10-CM

## 2020-04-27 DIAGNOSIS — O0993 Supervision of high risk pregnancy, unspecified, third trimester: Secondary | ICD-10-CM

## 2020-04-27 DIAGNOSIS — O0990 Supervision of high risk pregnancy, unspecified, unspecified trimester: Secondary | ICD-10-CM

## 2020-04-27 LAB — POCT URINALYSIS DIPSTICK OB
Glucose, UA: NEGATIVE
POC,PROTEIN,UA: NEGATIVE

## 2020-04-27 NOTE — Progress Notes (Signed)
ROB- cervix check/no concerns
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
## 2020-05-02 ENCOUNTER — Ambulatory Visit (INDEPENDENT_AMBULATORY_CARE_PROVIDER_SITE_OTHER): Payer: Medicaid Other | Admitting: Obstetrics

## 2020-05-02 ENCOUNTER — Other Ambulatory Visit: Payer: Self-pay

## 2020-05-02 VITALS — BP 136/72 | Ht 68.0 in | Wt 246.2 lb

## 2020-05-02 DIAGNOSIS — O0990 Supervision of high risk pregnancy, unspecified, unspecified trimester: Secondary | ICD-10-CM

## 2020-05-02 DIAGNOSIS — Z3A40 40 weeks gestation of pregnancy: Secondary | ICD-10-CM
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
## 2020-05-04 DIAGNOSIS — Z20822 Contact with and (suspected) exposure to covid-19: Secondary | ICD-10-CM | POA: Diagnosis not present

## 2020-05-06 ENCOUNTER — Other Ambulatory Visit: Payer: Self-pay | Admitting: Obstetrics

## 2020-05-06 NOTE — Addendum Note (Signed)
Addended by: Mirna Mires on: 05/06/2020 11:47 AM   Modules accepted: Orders, SmartSet
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
## 2020-05-07 ENCOUNTER — Inpatient Hospital Stay
Admission: EM | Admit: 2020-05-07 | Discharge: 2020-05-09 | DRG: 807 | Disposition: A | Discharge status: Home / Self Care | Payer: Medicaid Other | Attending: Obstetrics and Gynecology | Admitting: Obstetrics and Gynecology

## 2020-05-07 ENCOUNTER — Encounter: Payer: Self-pay | Admitting: Obstetrics

## 2020-05-07 ENCOUNTER — Inpatient Hospital Stay: Payer: Medicaid Other | Admitting: Certified Registered"

## 2020-05-07 ENCOUNTER — Other Ambulatory Visit: Payer: Self-pay

## 2020-05-07 DIAGNOSIS — O99210 Obesity complicating pregnancy, unspecified trimester: Secondary | ICD-10-CM | POA: Diagnosis not present

## 2020-05-07 DIAGNOSIS — O0990 Supervision of high risk pregnancy, unspecified, unspecified trimester: Secondary | ICD-10-CM

## 2020-05-07 DIAGNOSIS — Z3A41 41 weeks gestation of pregnancy: Secondary | ICD-10-CM

## 2020-05-07 DIAGNOSIS — O480 Post-term pregnancy: Principal | ICD-10-CM | POA: Diagnosis present

## 2020-05-07 LAB — CBC
HCT: 35.1 % — ABNORMAL LOW (ref 36.0–46.0)
Hemoglobin: 11.4 g/dL — ABNORMAL LOW (ref 12.0–15.0)
MCH: 25.1 pg — ABNORMAL LOW (ref 26.0–34.0)
MCHC: 32.5 g/dL (ref 30.0–36.0)
MCV: 77.3 fL — ABNORMAL LOW (ref 80.0–100.0)
Platelets: 157 10*3/uL (ref 150–400)
RBC: 4.54 MIL/uL (ref 3.87–5.11)
RDW: 15.2 % (ref 11.5–15.5)
WBC: 9.6 10*3/uL (ref 4.0–10.5)
nRBC: 0 % (ref 0.0–0.2)

## 2020-05-07 LAB — URINE DRUG SCREEN, QUALITATIVE (ARMC ONLY)
Amphetamines, Ur Screen: NOT DETECTED
Barbiturates, Ur Screen: NOT DETECTED
Benzodiazepine, Ur Scrn: NOT DETECTED
Cannabinoid 50 Ng, Ur ~~LOC~~: NOT DETECTED
Cocaine Metabolite,Ur ~~LOC~~: NOT DETECTED
MDMA (Ecstasy)Ur Screen: NOT DETECTED
Methadone Scn, Ur: NOT DETECTED
Opiate, Ur Screen: NOT DETECTED
Phencyclidine (PCP) Ur S: NOT DETECTED
Tricyclic, Ur Screen: NOT DETECTED

## 2020-05-07 LAB — TYPE AND SCREEN
ABO/RH(D): O POS
Antibody Screen: NEGATIVE

## 2020-05-07 LAB — RPR: RPR Ser Ql: NONREACTIVE

## 2020-05-07 MED ORDER — BENZOCAINE-MENTHOL 20-0.5 % EX AERO
1.0000 "application " | INHALATION_SPRAY | CUTANEOUS | Status: DC | PRN
  Administered 2020-05-08: 1 via TOPICAL
  Filled 2020-05-07: qty 56

## 2020-05-07 MED ORDER — LIDOCAINE HCL (PF) 1 % IJ SOLN
INTRAMUSCULAR | Status: DC | PRN
  Administered 2020-05-07 (×3): 2 mL

## 2020-05-07 MED ORDER — DIPHENHYDRAMINE HCL 50 MG/ML IJ SOLN: 12.5000 mg | INTRAMUSCULAR | Status: DC | PRN

## 2020-05-07 MED ORDER — WITCH HAZEL-GLYCERIN EX PADS: 1.0000 "application " | MEDICATED_PAD | CUTANEOUS | Status: DC | PRN

## 2020-05-07 MED ORDER — SODIUM CHLORIDE 0.9 % IV SOLN
5.0000 10*6.[IU] | Freq: Once | INTRAVENOUS | Status: AC
  Administered 2020-05-07: 5 10*6.[IU] via INTRAVENOUS
  Filled 2020-05-07: qty 5

## 2020-05-07 MED ORDER — LIDOCAINE-EPINEPHRINE (PF) 1.5 %-1:200000 IJ SOLN
INTRAMUSCULAR | Status: DC | PRN
  Administered 2020-05-07: 3 mL via PERINEURAL

## 2020-05-07 MED ORDER — OXYCODONE HCL 5 MG PO TABS: 5.0000 mg | ORAL_TABLET | ORAL | Status: DC | PRN

## 2020-05-07 MED ORDER — OXYTOCIN-SODIUM CHLORIDE 30-0.9 UT/500ML-% IV SOLN
1.0000 m[IU]/min | INTRAVENOUS | Status: DC
  Administered 2020-05-07: 2 m[IU]/min via INTRAVENOUS
  Filled 2020-05-07: qty 1000

## 2020-05-07 MED ORDER — LACTATED RINGERS IV SOLN: 500.0000 mL | INTRAVENOUS | Status: DC | PRN

## 2020-05-07 MED ORDER — PHENYLEPHRINE 40 MCG/ML (10ML) SYRINGE FOR IV PUSH (FOR BLOOD PRESSURE SUPPORT)
80.0000 ug | PREFILLED_SYRINGE | INTRAVENOUS | Status: DC | PRN
  Filled 2020-05-07: qty 10

## 2020-05-07 MED ORDER — ACETAMINOPHEN 325 MG PO TABS: 650.0000 mg | ORAL_TABLET | ORAL | Status: DC | PRN

## 2020-05-07 MED ORDER — FENTANYL 2.5 MCG/ML W/ROPIVACAINE 0.15% IN NS 100 ML EPIDURAL (ARMC)
EPIDURAL | Status: AC
  Filled 2020-05-07: qty 100

## 2020-05-07 MED ORDER — OXYTOCIN 10 UNIT/ML IJ SOLN
INTRAMUSCULAR | Status: AC
  Filled 2020-05-07: qty 2

## 2020-05-07 MED ORDER — BUPIVACAINE HCL (PF) 0.25 % IJ SOLN
INTRAMUSCULAR | Status: DC | PRN
  Administered 2020-05-07: 4 mL via EPIDURAL
  Administered 2020-05-07: 2 mL via EPIDURAL

## 2020-05-07 MED ORDER — MISOPROSTOL 100 MCG PO TABS
25.0000 ug | ORAL_TABLET | ORAL | Status: DC | PRN
  Administered 2020-05-07 (×2): 25 ug via VAGINAL
  Filled 2020-05-07 (×5): qty 1

## 2020-05-07 MED ORDER — AMMONIA AROMATIC IN INHA
RESPIRATORY_TRACT | Status: AC
  Filled 2020-05-07: qty 10

## 2020-05-07 MED ORDER — ZOLPIDEM TARTRATE 5 MG PO TABS: 5.0000 mg | ORAL_TABLET | Freq: Every evening | ORAL | Status: DC | PRN

## 2020-05-07 MED ORDER — EPHEDRINE 5 MG/ML INJ
10.0000 mg | INTRAVENOUS | Status: DC | PRN
  Filled 2020-05-07: qty 2

## 2020-05-07 MED ORDER — LACTATED RINGERS IV SOLN: 500.0000 mL | Freq: Once | INTRAVENOUS | Status: DC

## 2020-05-07 MED ORDER — LACTATED RINGERS IV SOLN: INTRAVENOUS | Status: DC

## 2020-05-07 MED ORDER — COCONUT OIL OIL
1.0000 "application " | TOPICAL_OIL | Status: DC | PRN
  Administered 2020-05-08: 1 via TOPICAL
  Filled 2020-05-07: qty 120

## 2020-05-07 MED ORDER — SIMETHICONE 80 MG PO CHEW: 80.0000 mg | CHEWABLE_TABLET | ORAL | Status: DC | PRN

## 2020-05-07 MED ORDER — OXYCODONE-ACETAMINOPHEN 5-325 MG PO TABS: 1.0000 | ORAL_TABLET | ORAL | Status: DC | PRN

## 2020-05-07 MED ORDER — PRENATAL MULTIVITAMIN CH
1.0000 | ORAL_TABLET | Freq: Every day | ORAL | Status: DC
  Administered 2020-05-08 – 2020-05-09 (×2): 1 via ORAL
  Filled 2020-05-07 (×2): qty 1

## 2020-05-07 MED ORDER — DOCUSATE SODIUM 100 MG PO CAPS
100.0000 mg | ORAL_CAPSULE | Freq: Two times a day (BID) | ORAL | Status: DC
  Administered 2020-05-07 – 2020-05-09 (×4): 100 mg via ORAL
  Filled 2020-05-07 (×4): qty 1

## 2020-05-07 MED ORDER — OXYTOCIN BOLUS FROM INFUSION
333.0000 mL | Freq: Once | INTRAVENOUS | Status: AC
  Administered 2020-05-07: 333 mL via INTRAVENOUS

## 2020-05-07 MED ORDER — FENTANYL 2.5 MCG/ML W/ROPIVACAINE 0.15% IN NS 100 ML EPIDURAL (ARMC)
12.0000 mL/h | EPIDURAL | Status: DC
  Administered 2020-05-07: 12 mL/h via EPIDURAL

## 2020-05-07 MED ORDER — ONDANSETRON HCL 4 MG/2ML IJ SOLN: 4.0000 mg | INTRAMUSCULAR | Status: DC | PRN

## 2020-05-07 MED ORDER — IBUPROFEN 600 MG PO TABS
600.0000 mg | ORAL_TABLET | Freq: Four times a day (QID) | ORAL | Status: DC
  Administered 2020-05-07 – 2020-05-08 (×2): 600 mg via ORAL
  Filled 2020-05-07 (×2): qty 1

## 2020-05-07 MED ORDER — MISOPROSTOL 200 MCG PO TABS
ORAL_TABLET | ORAL | Status: AC
  Filled 2020-05-07: qty 4

## 2020-05-07 MED ORDER — DIPHENHYDRAMINE HCL 25 MG PO CAPS: 25.0000 mg | ORAL_CAPSULE | Freq: Four times a day (QID) | ORAL | Status: DC | PRN

## 2020-05-07 MED ORDER — ONDANSETRON HCL 4 MG/2ML IJ SOLN: 4.0000 mg | Freq: Four times a day (QID) | INTRAMUSCULAR | Status: DC | PRN

## 2020-05-07 MED ORDER — OXYTOCIN-SODIUM CHLORIDE 30-0.9 UT/500ML-% IV SOLN: 2.5000 [IU]/h | INTRAVENOUS | Status: DC

## 2020-05-07 MED ORDER — TERBUTALINE SULFATE 1 MG/ML IJ SOLN: 0.2500 mg | Freq: Once | INTRAMUSCULAR | Status: DC | PRN

## 2020-05-07 MED ORDER — OXYCODONE HCL 5 MG PO TABS: 10.0000 mg | ORAL_TABLET | ORAL | Status: DC | PRN

## 2020-05-07 MED ORDER — ACETAMINOPHEN 325 MG PO TABS
650.0000 mg | ORAL_TABLET | ORAL | Status: DC | PRN
  Administered 2020-05-08 (×2): 650 mg via ORAL
  Filled 2020-05-07 (×2): qty 2

## 2020-05-07 MED ORDER — TETANUS-DIPHTH-ACELL PERTUSSIS 5-2.5-18.5 LF-MCG/0.5 IM SUSP: 0.5000 mL | Freq: Once | INTRAMUSCULAR | Status: DC

## 2020-05-07 MED ORDER — PENICILLIN G POT IN DEXTROSE 60000 UNIT/ML IV SOLN
3.0000 10*6.[IU] | INTRAVENOUS | Status: DC
  Administered 2020-05-07 (×2): 3 10*6.[IU] via INTRAVENOUS
  Filled 2020-05-07 (×7): qty 50

## 2020-05-07 MED ORDER — SOD CITRATE-CITRIC ACID 500-334 MG/5ML PO SOLN: 30.0000 mL | ORAL | Status: DC | PRN

## 2020-05-07 MED ORDER — OXYCODONE-ACETAMINOPHEN 5-325 MG PO TABS: 2.0000 | ORAL_TABLET | ORAL | Status: DC | PRN

## 2020-05-07 MED ORDER — LIDOCAINE HCL (PF) 1 % IJ SOLN
30.0000 mL | INTRAMUSCULAR | Status: DC | PRN
  Filled 2020-05-07: qty 30

## 2020-05-07 MED ORDER — DIBUCAINE (PERIANAL) 1 % EX OINT: 1.0000 "application " | TOPICAL_OINTMENT | CUTANEOUS | Status: DC | PRN

## 2020-05-07 MED ORDER — ONDANSETRON HCL 4 MG PO TABS: 4.0000 mg | ORAL_TABLET | ORAL | Status: DC | PRN

## 2020-05-07 NOTE — Progress Notes (Signed)
Charlene Smith is a 29 y.o. G3P1011 at [redacted]w[redacted]d by ultrasound admitted for induction of labor due to Post dates.she received two doses of Cytotec since her admission, and is now on low dose pitocin (66mu/min) Charlene Smith is very uncomfortable and would like some pain medication. Her labor is felt primarily in her back. She is grimacing and breathing through her contractions. She has moved, and changed positions this morning to facilitate optimal fetal positioning , but the back labor contiues to be stronger and has not changed. She would like to be checked.  Subjective:  See above   Objective: BP 128/80   Pulse 87   Temp (!) 97.5 F (36.4 C) (Oral)   Resp 18   Ht 5\' 8"  (1.727 m)   Wt 111.6 kg   LMP 08/10/2019 (Approximate) Comment: normal  BMI 37.40 kg/m  I/O last 3 completed shifts: In: 792.2 [I.V.:792.2] Out: -  No intake/output data recorded.  FHT:  FHR: 135 bpm, variability: moderate,  accelerations:  Present,  decelerations:  Absent UC:   regular, every 3 minutes SVE:   Dilation: 4 Effacement (%): 50 Station: -3 Exam by:: [email or web address] CNM   Bishop score: 5  Pitocin at 31mu/min  Labs: Lab Results  Component Value Date   WBC 9.6 05/07/2020   HGB 11.4 (L) 05/07/2020   HCT 35.1 (L) 05/07/2020   MCV 77.3 (L) 05/07/2020   PLT 157 05/07/2020    Assessment / Plan: Induction of labor due to postterm,  progressing well on pitocin  Labor: Progressing on Pitocin, will continue to increase then AROM  Fetal Wellbeing:  Category I Pain Control:  Epidural desired I/D:  n/a Anticipated MOD:  NSVD  Anesthesia contacted for epidural. Will recheck in several hours once comfortable.  07/08/2020 05/07/2020, 1:06 PM

## 2020-05-07 NOTE — Discharge Summary (Signed)
Postpartum Discharge Summary  Date of Service updated 05/09/2020     Patient Name: Charlene Smith DOB: 1991/02/22 MRN: [id number]  Date of admission: 05/07/2020 Delivery date:05/07/2020  Delivering provider: Imagene Riches  Date of discharge: 05/09/2020  Admitting diagnosis: Post-dates pregnancy [O48.0] Intrauterine pregnancy: [redacted]w[redacted]d    Secondary diagnosis:  Active Problems:   Post-dates pregnancy   Postpartum care following vaginal delivery   Encounter for care or examination of lactating mother  Additional problems: none    Discharge diagnosis: Term Pregnancy Delivered                                              Post partum procedures:NA Augmentation: Pitocin and Cytotec Complications: None  Hospital course: Induction of Labor With Vaginal Delivery   29y.o. yo [id number] [id number] admitted to the hospital 05/07/2020 for induction of labor.  Indication for induction: Postdates.  Patient had an uncomplicated labor course as follows: Membrane Rupture Time/Date: 4:57 PM ,05/07/2020   Delivery Method:Vaginal, Spontaneous  Episiotomy: None  Lacerations:  None  Details of delivery can be found in separate delivery note.  Patient had a routine postpartum course. Patient is discharged home 05/09/20.  Newborn Data: Birth date:05/07/2020  Birth time:4:57 PM  Gender:Female  Living status:Living  Apgars:6 ,8  Weight:3980 g   Magnesium Sulfate received: No BMZ received: No Rhophylac:No MMR:Yes T-DaP:Given prenatally Flu: N/A Transfusion:No  Physical exam  Vitals:   05/08/20 1157 05/08/20 1626 05/08/20 2250 05/09/20 0754  BP: 120/78 120/84 124/84 119/76  Pulse: 90 83 91 83  Resp: '18 18 18 18  ' Temp: 98 F (36.7 C) 98.3 F (36.8 C) 98.4 F (36.9 C) 97.8 F (36.6 C)  TempSrc:   Axillary Oral  SpO2: 98% 100% 100% 100%  Weight:      Height:       General: alert, cooperative and no distress Lochia: appropriate Uterine Fundus: firm Incision: N/A DVT  Evaluation: No evidence of DVT seen on physical exam. Labs: Lab Results  Component Value Date   WBC 11.5 (H) 05/08/2020   HGB 11.6 (L) 05/08/2020   HCT 34.4 (L) 05/08/2020   MCV 76.1 (L) 05/08/2020   PLT 136 (L) 05/08/2020   CMP Latest Ref Rng & Units 10/26/2019  Glucose 65 - 99 mg/dL 100(H)  BUN 6 - 20 mg/dL 6  Creatinine 0.57 - 1.00 mg/dL 0.45(L)  Sodium 134 - 144 mmol/L 138  Potassium 3.5 - 5.2 mmol/L 3.8  Chloride 96 - 106 mmol/L 103  CO2 20 - 29 mmol/L 21  Calcium 8.7 - 10.2 mg/dL 8.9  Total Protein 6.0 - 8.5 g/dL 6.7  Total Bilirubin 0.0 - 1.2 mg/dL 0.2  Alkaline Phos 39 - 117 IU/L 71  AST 0 - 40 IU/L 17  ALT 0 - 32 IU/L 18   Edinburgh Score: Edinburgh Postnatal Depression Scale Screening Tool 05/08/2020  I have been able to laugh and see the funny side of things. 0  I have looked forward with enjoyment to things. 0  I have blamed myself unnecessarily when things went wrong. 0  I have been anxious or worried for no good reason. 0  I have felt scared or panicky for no good reason. 0  Things have been getting on top of me. 0  I have been so unhappy that I have had difficulty  sleeping. 0  I have felt sad or miserable. 0  I have been so unhappy that I have been crying. 0  The thought of harming myself has occurred to me. 0  Edinburgh Postnatal Depression Scale Total 0      After visit meds:  Allergies as of 05/09/2020   No Known Allergies     Medication List    TAKE these medications   norethindrone 0.35 MG tablet Commonly known as: MICRONOR Take 1 tablet (0.35 mg total) by mouth daily. Start taking on: May 27, 2020   PRENATAL VITAMIN PO Take 2 tablets by mouth every morning.        Discharge home in stable condition Infant Feeding: Breast Infant Disposition:home with mother Discharge instruction: per After Visit Summary and Postpartum booklet. Activity: Advance as tolerated. Pelvic rest for 6 weeks.  Diet: routine diet Anticipated Birth Control:  POPs Postpartum Appointment:6 weeks Additional Postpartum F/U: none Future Appointments:No future appointments. Follow up Visit:  [place], [place], CNM Follow up in 6 week(s).   Specialties: Obstetrics, Gynecology Contact information: [address] [place] [address] [phone]               05/09/2020 Rod Can, CNM

## 2020-05-07 NOTE — Anesthesia Preprocedure Evaluation (Signed)
Anesthesia Evaluation  Patient identified by MRN, date of birth, ID band Patient awake    Reviewed: Allergy & Precautions, H&P , NPO status , Patient's Chart, lab work & pertinent test results  Airway Mallampati: II   Neck ROM: full    Dental no notable dental hx.    Pulmonary neg pulmonary ROS,           Cardiovascular negative cardio ROS       Neuro/Psych    GI/Hepatic Neg liver ROS, GERD  ,  Endo/Other  negative endocrine ROS  Renal/GU negative Renal ROS     Musculoskeletal   Abdominal   Peds  Hematology negative hematology ROS (+)   Anesthesia Other Findings   Reproductive/Obstetrics (+) Pregnancy                             Anesthesia Physical Anesthesia Plan  ASA: II  Anesthesia Plan: Epidural   Post-op Pain Management:    Induction:   PONV Risk Score and Plan:   Airway Management Planned:   Additional Equipment:   Intra-op Plan:   Post-operative Plan:   Informed Consent: I have reviewed the patients History and Physical, chart, labs and discussed the procedure including the risks, benefits and alternatives for the proposed anesthesia with the patient or authorized representative who has indicated his/her understanding and acceptance.       Plan Discussed with: CRNA and Anesthesiologist  Anesthesia Plan Comments:         Anesthesia Quick Evaluation

## 2020-05-07 NOTE — Anesthesia Procedure Notes (Addendum)
Epidural Patient location during procedure: OB Start time: 05/07/2020 12:57 PM  Staffing Anesthesiologist: Alver Fisher, MD Resident/CRNA: Rosanne Gutting, CRNA Performed: resident/CRNA   Preanesthetic Checklist Completed: patient identified, IV checked, site marked, risks and benefits discussed, surgical consent, monitors and equipment checked, pre-op evaluation and timeout performed  Epidural Patient position: sitting Prep: ChloraPrep Patient monitoring: continuous pulse ox and blood pressure Approach: midline Location: L3-L4 Injection technique: LOR saline  Needle:  Needle type: Tuohy  Needle gauge: 17 G Needle length: 9 cm and 9 Needle insertion depth: 7.5 cm Catheter type: closed end flexible Catheter size: 19 Gauge Catheter at skin depth: 12 cm  Assessment Events: blood not aspirated, injection not painful, no injection resistance, no paresthesia and negative IV test  Additional Notes 3 attempt Pt. Evaluated and documentation done after procedure finished. Patient identified. Risks/Benefits/Options discussed with patient including but not limited to bleeding, infection, nerve damage, paralysis, failed block, incomplete pain control, headache, blood pressure changes, nausea, vomiting, reactions to medication both or allergic, itching and postpartum back pain. Confirmed with bedside nurse the patient's most recent platelet count. Confirmed with patient that they are not currently taking any anticoagulation, have any bleeding history or any family history of bleeding disorders. Patient expressed understanding and wished to proceed. All questions were answered. Sterile technique was used throughout the entire procedure. Please see nursing notes for vital signs. Test dose was given through epidural catheter and negative prior to continuing to dose epidural or start infusion. Warning signs of high block given to the patient including shortness of breath, tingling/numbness in  hands, complete motor block, or any concerning symptoms with instructions to call for help. Patient was given instructions on fall risk and not to get out of bed. All questions and concerns addressed with instructions to call with any issues or inadequate analgesia.   Patient tolerated the insertion well without immediate complications.Reason for block:procedure for pain

## 2020-05-07 NOTE — Progress Notes (Signed)
Charlene Smith is a 29 y.o. G3P1011 at [redacted]w[redacted]d by ultrasound admitted for induction of labor due to Post dates. Due date.   Called to paientt's room for some repetitive FHT decels. Pt is comfortable with her epidural. Sequence of decels treated with multiple position changes. She denies any rectal or vaginal pressure. She is in High fowlers at present.  Subjective:   Objective: BP 128/80    Pulse 87    Temp (!) 97.5 F (36.4 C) (Oral)    Resp 18    Ht 5\' 8"  (1.727 m)    Wt 111.6 kg    LMP 08/10/2019 (Approximate) Comment: normal   BMI 37.40 kg/m  I/O last 3 completed shifts: In: 792.2 [I.V.:792.2] Out: -  No intake/output data recorded.  FHT:  FHR: 140 bpm, variability: moderate,  accelerations:  Present,  decelerations:  Present repetetive early decels noted to 120s, with return to baseline by end of contraction. UC:   regular, every 2-3 minutes SVE:   Dilation: 7 Effacement (%): 80, 90 Station: -1 Exam by:: [email or web address] RN  Labs: Lab Results  Component Value Date   WBC 9.6 05/07/2020   HGB 11.4 (L) 05/07/2020   HCT 35.1 (L) 05/07/2020   MCV 77.3 (L) 05/07/2020   PLT 157 05/07/2020    Assessment / Plan: Induction of labor due to postterm,  progressing well on pitocin  Labor: Progressing on Pitocin, will continue to increase then AROM  Fetal Wellbeing:  Category II Pain Control:  Epidural I/D:  n/a Anticipated MOD:  NSVD  Set up Delivery table Monitor FHTs carefully.  07/08/2020 05/07/2020, 3:28 PM

## 2020-05-07 NOTE — Progress Notes (Signed)
Charlene Smith is a 29 y.o. G3P1011 at [redacted]w[redacted]d by ultrasound admitted for induction of labor due to Post dates. Due date 04/29/2020.  Subjective:   Objective: BP 127/67   Pulse 79   Temp 97.9 F (36.6 C) (Oral)   Resp 18   Ht 5\' 8"  (1.727 m)   Wt 111.6 kg   LMP 08/10/2019 (Approximate) Comment: normal  BMI 37.40 kg/m  I/O last 3 completed shifts: In: 792.2 [I.V.:792.2] Out: -  No intake/output data recorded.  FHT:  FHR: 135 bpm, variability: moderate,  accelerations:  Present,  decelerations:  Present one variable seen with UC this haf hour. over all very reassuring UC:   irregular, every 3-6  minutes SVE:   Dilation: 3.5 Effacement (%): 50 Station: -3 Exam by:: [email or web address] CNM  Bishop score 5-6  Labs: Lab Results  Component Value Date   WBC 9.6 05/07/2020   HGB 11.4 (L) 05/07/2020   HCT 35.1 (L) 05/07/2020   MCV 77.3 (L) 05/07/2020   PLT 157 05/07/2020    Assessment / Plan: Induction of labor due to postterm,  progressing well on pitocin  Labor: cervical ripening with cytotec  in progress.   Will lilely start pitocin at noon, 6 hours after last cytotec placed.  Fetal Wellbeing:  Category II Pain Control:  nothing at present. she plans epidural I/D:  n/a Anticipated MOD:  NSVD  07/08/2020 05/07/2020, 9:59 AM

## 2020-05-08 LAB — CBC
HCT: 34.4 % — ABNORMAL LOW (ref 36.0–46.0)
Hemoglobin: 11.6 g/dL — ABNORMAL LOW (ref 12.0–15.0)
MCH: 25.7 pg — ABNORMAL LOW (ref 26.0–34.0)
MCHC: 33.7 g/dL (ref 30.0–36.0)
MCV: 76.1 fL — ABNORMAL LOW (ref 80.0–100.0)
Platelets: 136 10*3/uL — ABNORMAL LOW (ref 150–400)
RBC: 4.52 MIL/uL (ref 3.87–5.11)
RDW: 15.6 % — ABNORMAL HIGH (ref 11.5–15.5)
WBC: 11.5 10*3/uL — ABNORMAL HIGH (ref 4.0–10.5)
nRBC: 0 % (ref 0.0–0.2)

## 2020-05-08 MED ORDER — IBUPROFEN 600 MG PO TABS
600.0000 mg | ORAL_TABLET | Freq: Four times a day (QID) | ORAL | Status: DC
  Administered 2020-05-08 – 2020-05-09 (×5): 600 mg via ORAL
  Filled 2020-05-08 (×5): qty 1

## 2020-05-08 NOTE — Anesthesia Postprocedure Evaluation (Signed)
Anesthesia Post Note  Patient: Charlene Smith  Procedure(s) Performed: AN AD HOC LABOR EPIDURAL  Patient location during evaluation: Mother Baby Anesthesia Type: Epidural Level of consciousness: awake and alert Pain management: pain level controlled Vital Signs Assessment: post-procedure vital signs reviewed and stable Respiratory status: spontaneous breathing, nonlabored ventilation and respiratory function stable Cardiovascular status: stable Postop Assessment: no headache, no backache and epidural receding Anesthetic complications: no   No complications documented.   Last Vitals:  Vitals:   05/07/20 2351 05/08/20 0325  BP: 119/75 125/83  Pulse: 76 70  Resp: 20 18  Temp: 36.7 C 36.7 C  SpO2: 100% 97%    Last Pain:  Vitals:   05/08/20 0425  TempSrc:   PainSc: Asleep                 Rosanne Gutting

## 2020-05-08 NOTE — Progress Notes (Signed)
Post Partum Day 1 Subjective: no complaints, voiding and tolerating PO. Breast fed initially and is supplementing with formula  Objective: Blood pressure 118/80, pulse 77, temperature 97.7 F (36.5 C), temperature source Oral, resp. rate (!) 2, height 5\' 8"  (1.727 m), weight 111.6 kg, last menstrual period 08/10/2019, SpO2 97 %, unknown if currently breastfeeding.  Physical Exam:  General: alert, cooperative and no distress Lochia: appropriate Uterine Fundus: firm  DVT Evaluation: No evidence of DVT seen on physical exam.  Recent Labs    05/07/20 0033 05/08/20 0522  HGB 11.4* 11.6*  HCT 35.1* 34.4*  WBC 9.6 11.5*  PLT 157 136*    Assessment/Plan: Stable PPD #1-continue postpartum care and breast feeding support Breast and bottle O POS/ RI/ VI TDAP-given AP Plan discharge tomorrow   LOS: 1 day   05/10/20 05/08/2020, 9:58 AM

## 2020-05-08 NOTE — Lactation Note (Signed)
This note was copied from a baby's chart. Lactation Consultation Note  Patient Name: Charlene Smith [id number] Date: 05/08/2020 Reason for consult: Initial assessment;Term  Lactation introduction to mom and baby Charlene Smith. Mom is G3P2, having low milk supply with first baby, mom is prepared to feed both formula and breastmilk with baby.  Baby has fed multiple times since delivery, initial pain, but with assistance of overnight RN mom feels his latch has improved. Mom is focusing on getting in more her areola into the mouth than just the nipple.  Mom has also given formula, 91ml last feeding, which was several hours ago, and baby is showing signs of spitty/gagging; uninterested in feeds. LC provided basic breastfeeding education: newborn stomach size, feeding patterns, early hunger cues, impact spitting/gagging can having on desire to feed, and importance of offering breast first at every feeding to help establish adequate milk supply.  Encouraged to rest while baby rests, and continue attempted feedings. Lactation name/number updated, encouraged to call with next feeding.  Maternal Data Formula Feeding for Exclusion: No  Feeding Feeding Type: Breast Fed  LATCH Score                   Interventions Interventions: Breast feeding basics reviewed  Lactation Tools Discussed/Used     Consult Status Consult Status: Follow-up Date: 05/08/20 Follow-up type: In-patient    Danford Bad 05/08/2020, 12:59 PM

## 2020-05-09 MED ORDER — NORETHINDRONE 0.35 MG PO TABS: 1.0000 | ORAL_TABLET | Freq: Every day | ORAL | 11 refills | Status: DC

## 2020-05-09 NOTE — Discharge Instructions (Signed)
Discharge Instructions:   Follow-up Appointment: Call ASAP to schedule your follow-up appointment for a visit in 6 weeks with Paula Compton, CNM!   If there are any new medications, they have been ordered and will be available for pickup at the listed pharmacy on your way home from the hospital.   Call office if you have any of the following: headache, visual changes, fever >101.0 F, chills, shortness of breath, breast concerns, excessive vaginal bleeding, incision drainage or problems, leg pain or redness, depression or any other concerns. If you have vaginal discharge with an odor, let your doctor know.   It is normal to bleed for up to 6 weeks. You should not soak through more than 1 pad in 1 hour. If you have a blood clot larger than your fist with continued bleeding, call your doctor.   Activity: Do not lift > 10 lbs for 6 weeks (do not lift anything heavier than your baby). No intercourse, tampons, swimming pools, hot tubs, baths (only showers) for 6 weeks.  No driving for 1-2 weeks. Continue prenatal vitamin, especially if breastfeeding. Increase calories and fluids (water) while breastfeeding.   Your milk will come in, in the next couple of days (right now it is colostrum). You may have a slight fever when your milk comes in, but it should go away on its own.  If it does not, and rises above 101 F please call the doctor. You will also feel achy and your breasts will be firm. They will also start to leak. If you are breastfeeding, continue as you have been and you can pump/express milk for comfort.   If you have too much milk, your breasts can become engorged, which could lead to mastitis. This is an infection of the milk ducts. It can be very painful and you will need to notify your doctor to obtain a prescription for antibiotics. You can also treat it with a shower or hot/cold compress.   For concerns about your baby, please call your pediatrician.  For breastfeeding concerns, the  lactation consultant can be reached at [phone].   Postpartum blues (feelings of happy one minute and sad another minute) are normal for the first few weeks but if it gets worse let your doctor know.   Congratulations! We enjoyed caring for you and your new bundle of joy!    Postpartum Care After Vaginal Delivery This sheet gives you information about how to care for yourself from the time you deliver your baby to up to 6-12 weeks after delivery (postpartum period). Your health care provider may also give you more specific instructions. If you have problems or questions, contact your health care provider. Follow these instructions at home: Vaginal bleeding  It is normal to have vaginal bleeding (lochia) after delivery. Wear a sanitary pad for vaginal bleeding and discharge. ? During the first week after delivery, the amount and appearance of lochia is often similar to a menstrual period. ? Over the next few weeks, it will gradually decrease to a dry, yellow-brown discharge. ? For most women, lochia stops completely by 4-6 weeks after delivery. Vaginal bleeding can vary from woman to woman.  Change your sanitary pads frequently. Watch for any changes in your flow, such as: ? A sudden increase in volume. ? A change in color. ? Large blood clots.  If you pass a blood clot from your vagina, save it and call your health care provider to discuss. Do not flush blood clots down the toilet before  talking with your health care provider.  Do not use tampons or douches until your health care provider says this is safe.  If you are not breastfeeding, your period should return 6-8 weeks after delivery. If you are feeding your child breast milk only (exclusive breastfeeding), your period may not return until you stop breastfeeding. Perineal care  Keep the area between the vagina and the anus (perineum) clean and dry as told by your health care provider. Use medicated pads and pain-relieving sprays  and creams as directed.  If you had a cut in the perineum (episiotomy) or a tear in the vagina, check the area for signs of infection until you are healed. Check for: ? More redness, swelling, or pain. ? Fluid or blood coming from the cut or tear. ? Warmth. ? Pus or a bad smell.  You may be given a squirt bottle to use instead of wiping to clean the perineum area after you go to the bathroom. As you start healing, you may use the squirt bottle before wiping yourself. Make sure to wipe gently.  To relieve pain caused by an episiotomy, a tear in the vagina, or swollen veins in the anus (hemorrhoids), try taking a warm sitz bath 2-3 times a day. A sitz bath is a warm water bath that is taken while you are sitting down. The water should only come up to your hips and should cover your buttocks. Breast care  Within the first few days after delivery, your breasts may feel heavy, full, and uncomfortable (breast engorgement). Milk may also leak from your breasts. Your health care provider can suggest ways to help relieve the discomfort. Breast engorgement should go away within a few days.  If you are breastfeeding: ? Wear a bra that supports your breasts and fits you well. ? Keep your nipples clean and dry. Apply creams and ointments as told by your health care provider. ? You may need to use breast pads to absorb milk that leaks from your breasts. ? You may have uterine contractions every time you breastfeed for up to several weeks after delivery. Uterine contractions help your uterus return to its normal size. ? If you have any problems with breastfeeding, work with your health care provider or Advertising copywriter.  If you are not breastfeeding: ? Avoid touching your breasts a lot. Doing this can make your breasts produce more milk. ? Wear a good-fitting bra and use cold packs to help with swelling. ? Do not squeeze out (express) milk. This causes you to make more milk. Intimacy and  sexuality  Ask your health care provider when you can engage in sexual activity. This may depend on: ? Your risk of infection. ? How fast you are healing. ? Your comfort and desire to engage in sexual activity.  You are able to get pregnant after delivery, even if you have not had your period. If desired, talk with your health care provider about methods of birth control (contraception). Medicines  Take over-the-counter and prescription medicines only as told by your health care provider.  If you were prescribed an antibiotic medicine, take it as told by your health care provider. Do not stop taking the antibiotic even if you start to feel better. Activity  Gradually return to your normal activities as told by your health care provider. Ask your health care provider what activities are safe for you.  Rest as much as possible. Try to rest or take a nap while your baby is sleeping. Eating  and drinking   Drink enough fluid to keep your urine pale yellow.  Eat high-fiber foods every day. These may help prevent or relieve constipation. High-fiber foods include: ? Whole grain cereals and breads. ? Brown rice. ? Beans. ? Fresh fruits and vegetables.  Do not try to lose weight quickly by cutting back on calories.  Take your prenatal vitamins until your postpartum checkup or until your health care provider tells you it is okay to stop. Lifestyle  Do not use any products that contain nicotine or tobacco, such as cigarettes and e-cigarettes. If you need help quitting, ask your health care provider.  Do not drink alcohol, especially if you are breastfeeding. General instructions  Keep all follow-up visits for you and your baby as told by your health care provider. Most women visit their health care provider for a postpartum checkup within the first 3-6 weeks after delivery. Contact a health care provider if:  You feel unable to cope with the changes that your child brings to your life,  and these feelings do not go away.  You feel unusually sad or worried.  Your breasts become red, painful, or hard.  You have a fever.  You have trouble holding urine or keeping urine from leaking.  You have little or no interest in activities you used to enjoy.  You have not breastfed at all and you have not had a menstrual period for 12 weeks after delivery.  You have stopped breastfeeding and you have not had a menstrual period for 12 weeks after you stopped breastfeeding.  You have questions about caring for yourself or your baby.  You pass a blood clot from your vagina. Get help right away if:  You have chest pain.  You have difficulty breathing.  You have sudden, severe leg pain.  You have severe pain or cramping in your lower abdomen.  You bleed from your vagina so much that you fill more than one sanitary pad in one hour. Bleeding should not be heavier than your heaviest period.  You develop a severe headache.  You faint.  You have blurred vision or spots in your vision.  You have bad-smelling vaginal discharge.  You have thoughts about hurting yourself or your baby. If you ever feel like you may hurt yourself or others, or have thoughts about taking your own life, get help right away. You can go to the nearest emergency department or call:  Your local emergency services (911 in the [place]  A suicide crisis helpline, such as the National Suicide Prevention Lifeline at 850-746-23931-248-769-9170. This is open 24 hours a day. Summary  The period of time right after you deliver your newborn up to 6-12 weeks after delivery is called the postpartum period.  Gradually return to your normal activities as told by your health care provider.  Keep all follow-up visits for you and your baby as told by your health care provider. This information is not intended to replace advice given to you by your health care provider. Make sure you discuss any questions you have with your health  care provider. Document Revised: 10/16/2017 Document Reviewed: 07/27/2017 Elsevier Patient Education  2020 [employer] Postpartum Baby Blues The postpartum period begins right after the birth of a baby. During this time, there is often a lot of joy and excitement. It is also a time of many changes in the life of the parents. No matter how many times a mother gives birth, each child brings new challenges to the  family, including different ways of relating to one another. It is common to have feelings of excitement along with confusing changes in moods, emotions, and thoughts. You may feel happy one minute and sad or stressed the next. These feelings of sadness usually happen in the period right after you have your baby, and they go away within a week or two. This is called the "baby blues." What are the causes? There is no known cause of baby blues. It is likely caused by a combination of factors. However, changes in hormone levels after childbirth are believed to trigger some of the symptoms. Other factors that can play a role in these mood changes include:  Lack of sleep.  Stressful life events, such as poverty, caring for a loved one, or death of a loved one.  Genetics. What are the signs or symptoms? Symptoms of this condition include:  Brief changes in mood, such as going from extreme happiness to sadness.  Decreased concentration.  Difficulty sleeping.  Crying spells and tearfulness.  Loss of appetite.  Irritability.  Anxiety. If the symptoms of baby blues last for more than 2 weeks or become more severe, you may have postpartum depression. How is this diagnosed? This condition is diagnosed based on an evaluation of your symptoms. There are no medical or lab tests that lead to a diagnosis, but there are various questionnaires that a health care provider may use to identify women with the baby blues or postpartum depression. How is this treated? Treatment is not needed for  this condition. The baby blues usually go away on their own in 1-2 weeks. Social support is often all that is needed. You will be encouraged to get adequate sleep and rest. Follow these instructions at home: Lifestyle      Get as much rest as you can. Take a nap when the baby sleeps.  Exercise regularly as told by your health care provider. Some women find yoga and walking to be helpful.  Eat a balanced and nourishing diet. This includes plenty of fruits and vegetables, whole grains, and lean proteins.  Do little things that you enjoy. Have a cup of tea, take a bubble bath, read your favorite magazine, or listen to your favorite music.  Avoid alcohol.  Ask for help with household chores, cooking, grocery shopping, or running errands. Do not try to do everything yourself. Consider hiring a postpartum doula to help. This is a professional who specializes in providing support to new mothers.  Try not to make any major life changes during pregnancy or right after giving birth. This can add stress. General instructions  Talk to people close to you about how you are feeling. Get support from your partner, family members, friends, or other new moms. You may want to join a support group.  Find ways to cope with stress. This may include: ? Writing your thoughts and feelings in a journal. ? Spending time outside. ? Spending time with people who make you laugh.  Try to stay positive in how you think. Think about the things you are grateful for.  Take over-the-counter and prescription medicines only as told by your health care provider.  Let your health care provider know if you have any concerns.  Keep all postpartum visits as told by your health care provider. This is important. Contact a health care provider if:  Your baby blues do not go away after 2 weeks. Get help right away if:  You have thoughts of taking your own  life (suicidal thoughts).  You think you may harm the baby or  other people.  You see or hear things that are not there (hallucinations). Summary  After giving birth, you may feel happy one minute and sad or stressed the next. Feelings of sadness that happen right after the baby is born and go away after a week or two are called the "baby blues."  You can manage the baby blues by getting enough rest, eating a healthy diet, exercising, spending time with supportive people, and finding ways to cope with stress.  If feelings of sadness and stress last longer than 2 weeks or get in the way of caring for your baby, talk to your health care provider. This may mean you have postpartum depression. This information is not intended to replace advice given to you by your health care provider. Make sure you discuss any questions you have with your health care provider. Document Revised: 02/04/2019 Document Reviewed: 12/09/2016 Elsevier Patient Education  2020 [employer].

## 2020-05-09 NOTE — Lactation Note (Signed)
This note was copied from a baby's chart. Lactation Consultation Note  Patient Name: Charlene Smith [id number] Date: 05/09/2020 Reason for consult: Follow-up assessment;Term  Lactation follow-up before discharge. Baby primarily formula fed overnight, but mom reporting recent breastfeeding; mom experiencing breast swelling/soreness (potential onset of transitional/mature milk). LC discussed with mom feeding plan post-discharge. Mom desires to breastfeed, but is more comfortable having/using formula to ensure baby is adequately fed. LC discussed with mom milk supply and demand. Reviewed process for adequate milk production through frequent emptying, from baby or pump, breast fullness/engorgement management, and offering of breast before bottle. Mom verbalizes understanding and agrees with feeding plan.  Information given for newborn feeding patterns, changes in stomach sizes, ways to determine adequate in/milk transfer, and ongoing output expectations. Outpatient lactation services information and community breastfeeding resources given. Encouraged to call out today before discharge with any questions/concerns or for breastfeeding assistance.  Maternal Data Formula Feeding for Exclusion: No Has patient been taught Hand Expression?: Yes Does the patient have breastfeeding experience prior to this delivery?: Yes  Feeding    LATCH Score                   Interventions Interventions: Breast feeding basics reviewed  Lactation Tools Discussed/Used     Consult Status Consult Status: Complete Date: 05/09/20 Follow-up type: Call as needed    Danford Bad 05/09/2020, 9:49 AM

## 2020-05-09 NOTE — Progress Notes (Signed)
Patient discharged home with infant. Discharge instructions and prescriptions given and reviewed with patient. Patient verbalized understanding.   Encouraged pt to call and schedule follow-up appointment for a visit in 6 weeks with Paula Compton, CNM.  Escorted out by staff.

## 2020-05-24 ENCOUNTER — Telehealth: Payer: Self-pay

## 2020-05-24 NOTE — Telephone Encounter (Signed)
Pt called for a return to work note, I called her back to advise she could receive the letter at her 6 week post partum check, No answer so I left a message'

## 2020-06-08 ENCOUNTER — Ambulatory Visit (INDEPENDENT_AMBULATORY_CARE_PROVIDER_SITE_OTHER): Payer: Medicaid Other | Admitting: Obstetrics

## 2020-06-08 ENCOUNTER — Other Ambulatory Visit: Payer: Self-pay

## 2020-06-08 ENCOUNTER — Encounter: Payer: Self-pay | Admitting: Obstetrics

## 2020-06-08 DIAGNOSIS — Z3009 Encounter for other general counseling and advice on contraception: Secondary | ICD-10-CM
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
## 2020-06-08 NOTE — Patient Instructions (Signed)

## 2020-10-27 NOTE — L&D Delivery Note (Signed)
Delivery Note Primary OB: [facility] Delivery Physician: Annamarie Major, MD Gestational Age: Full term Antepartum complications: none Intrapartum complications: None  A viable Female was delivered via vertex presentation. "Charlene Smith" Apgars:8 ,9  Weight:  pending .   Placenta status: spontaneous and Intact.  Cord: 3+ vessels;  with the following complications: none.  Anesthesia:  epidural Episiotomy:  none Lacerations:  none Suture Repair: none Est. Blood Loss (mL):  less than 100 mL  Mom to postpartum.  Baby to Couplet care / Skin to Skin. Plans postpartum tubal tomorrow am   Annamarie Major, MD, [facility], [facility] 08/30/2021  5:52 AM [phone]

## 2021-01-01 ENCOUNTER — Encounter: Payer: Medicaid Other | Admitting: Obstetrics and Gynecology

## 2021-01-04 ENCOUNTER — Other Ambulatory Visit (HOSPITAL_COMMUNITY)
Admission: RE | Admit: 2021-01-04 | Discharge: 2021-01-04 | Disposition: A | Discharge status: Home / Self Care | Payer: Medicaid Other | Source: Ambulatory Visit | Attending: Advanced Practice Midwife | Admitting: Advanced Practice Midwife

## 2021-01-04 ENCOUNTER — Encounter: Payer: Self-pay | Admitting: Advanced Practice Midwife

## 2021-01-04 ENCOUNTER — Ambulatory Visit (INDEPENDENT_AMBULATORY_CARE_PROVIDER_SITE_OTHER): Payer: Medicaid Other | Admitting: Advanced Practice Midwife

## 2021-01-04 ENCOUNTER — Other Ambulatory Visit: Payer: Self-pay

## 2021-01-04 VITALS — BP 110/64 | HR 90 | Wt 214.0 lb

## 2021-01-04 DIAGNOSIS — Z3A01 Less than 8 weeks gestation of pregnancy: Secondary | ICD-10-CM

## 2021-01-04 DIAGNOSIS — N912 Amenorrhea, unspecified: Secondary | ICD-10-CM

## 2021-01-04 DIAGNOSIS — Z369 Encounter for antenatal screening, unspecified: Secondary | ICD-10-CM | POA: Diagnosis not present

## 2021-01-04 DIAGNOSIS — Z113 Encounter for screening for infections with a predominantly sexual mode of transmission: Secondary | ICD-10-CM

## 2021-01-04 DIAGNOSIS — O99211 Obesity complicating pregnancy, first trimester: Secondary | ICD-10-CM

## 2021-01-04 DIAGNOSIS — O0991 Supervision of high risk pregnancy, unspecified, first trimester: Secondary | ICD-10-CM | POA: Diagnosis not present

## 2021-01-04 DIAGNOSIS — Z124 Encounter for screening for malignant neoplasm of cervix: Secondary | ICD-10-CM | POA: Diagnosis not present

## 2021-01-04 LAB — POCT URINALYSIS DIPSTICK OB
Appearance: ABNORMAL
Bilirubin, UA: NEGATIVE
Blood, UA: NEGATIVE
Glucose, UA: NEGATIVE
Nitrite, UA: NEGATIVE
Odor: NORMAL
Spec Grav, UA: 1.025 (ref 1.010–1.025)
Urobilinogen, UA: 0.2 E.U./dL
pH, UA: 6 (ref 5.0–8.0)

## 2021-01-04 LAB — POCT URINE PREGNANCY: Preg Test, Ur: POSITIVE — AB

## 2021-01-04 NOTE — Progress Notes (Signed)
Ready Set Baby Information given today. Multiple positive hpt.
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
## 2021-01-04 NOTE — Patient Instructions (Signed)

## 2021-01-07 LAB — URINE CULTURE

## 2021-01-08 LAB — CYTOLOGY - PAP
Chlamydia: NEGATIVE
Comment: NEGATIVE
Comment: NEGATIVE
Comment: NORMAL
Diagnosis: NEGATIVE
Neisseria Gonorrhea: NEGATIVE
Trichomonas: NEGATIVE

## 2021-01-10 DIAGNOSIS — Z20822 Contact with and (suspected) exposure to covid-19: Secondary | ICD-10-CM | POA: Diagnosis not present

## 2021-01-25 ENCOUNTER — Other Ambulatory Visit: Payer: Self-pay | Admitting: Advanced Practice Midwife

## 2021-01-25 ENCOUNTER — Ambulatory Visit
Admission: RE | Admit: 2021-01-25 | Discharge: 2021-01-25 | Disposition: A | Discharge status: Home / Self Care | Payer: Medicaid Other | Source: Ambulatory Visit | Attending: Advanced Practice Midwife | Admitting: Advanced Practice Midwife

## 2021-01-25 ENCOUNTER — Other Ambulatory Visit: Payer: Self-pay

## 2021-01-25 DIAGNOSIS — O0991 Supervision of high risk pregnancy, unspecified, first trimester: Secondary | ICD-10-CM | POA: Insufficient documentation

## 2021-01-25 DIAGNOSIS — Z369 Encounter for antenatal screening, unspecified: Secondary | ICD-10-CM | POA: Diagnosis present

## 2021-01-25 DIAGNOSIS — O26841 Uterine size-date discrepancy, first trimester: Secondary | ICD-10-CM | POA: Diagnosis not present

## 2021-01-25 DIAGNOSIS — Z3A09 9 weeks gestation of pregnancy: Secondary | ICD-10-CM | POA: Diagnosis not present

## 2021-01-28 ENCOUNTER — Encounter: Payer: Medicaid Other | Admitting: Obstetrics and Gynecology

## 2021-02-01 ENCOUNTER — Other Ambulatory Visit: Payer: Self-pay

## 2021-02-01 ENCOUNTER — Telehealth: Payer: Self-pay

## 2021-02-01 ENCOUNTER — Ambulatory Visit (INDEPENDENT_AMBULATORY_CARE_PROVIDER_SITE_OTHER): Payer: Medicaid Other | Admitting: Advanced Practice Midwife

## 2021-02-01 ENCOUNTER — Encounter: Payer: Self-pay | Admitting: Advanced Practice Midwife

## 2021-02-01 VITALS — BP 110/60 | Wt 209.0 lb

## 2021-02-01 DIAGNOSIS — O0991 Supervision of high risk pregnancy, unspecified, first trimester: Secondary | ICD-10-CM | POA: Diagnosis not present

## 2021-02-01 DIAGNOSIS — Z369 Encounter for antenatal screening, unspecified: Secondary | ICD-10-CM | POA: Diagnosis not present

## 2021-02-01 DIAGNOSIS — Z1379 Encounter for other screening for genetic and chromosomal anomalies: Secondary | ICD-10-CM | POA: Diagnosis not present

## 2021-02-01 DIAGNOSIS — Z3A11 11 weeks gestation of pregnancy: Secondary | ICD-10-CM | POA: Diagnosis not present

## 2021-02-01 DIAGNOSIS — O99211 Obesity complicating pregnancy, first trimester: Secondary | ICD-10-CM

## 2021-02-01 NOTE — Telephone Encounter (Signed)
Patient reports she has not been able to keep down food or water for the past 48 hours. YH#[phone]

## 2021-02-01 NOTE — Patient Instructions (Addendum)
Morning Sickness  Morning sickness is when a woman feels nauseous during pregnancy. This nauseous feeling may or may not come with vomiting. It often occurs in the morning, but it can be a problem at any time of day. Morning sickness is most common during the first trimester. In some cases, it may continue throughout pregnancy. Although morning sickness is unpleasant, it is usually harmless unless the woman develops severe and continual vomiting (hyperemesis gravidarum), a condition that requires more intense treatment. What are the causes? The exact cause of this condition is not known, but it seems to be related to normal hormonal changes that occur in pregnancy. What increases the risk? You are more likely to develop this condition if:  You experienced nausea or vomiting before your pregnancy.  You had morning sickness during a previous pregnancy.  You are pregnant with more than one baby, such as twins. What are the signs or symptoms? Symptoms of this condition include:  Nausea.  Vomiting. How is this diagnosed? This condition is usually diagnosed based on your signs and symptoms. How is this treated? In many cases, treatment is not needed for this condition. Making some changes to what you eat may help to control symptoms. Your health care provider may also prescribe or recommend:  Vitamin B6 supplements.  Anti-nausea medicines.  Ginger. Follow these instructions at home: Medicines  Take over-the-counter and prescription medicines only as told by your health care provider. Do not use any prescription, over-the-counter, or herbal medicines for morning sickness without first talking with your health care provider.  Take multivitamins before getting pregnant. This can prevent or decrease the severity of morning sickness in most women. Eating and drinking  Eat a piece of dry toast or crackers before getting out of bed in the morning.  Eat 5 or 6 small meals a day.  Eat dry  and bland foods, such as rice or a baked potato. Foods that are high in carbohydrates are often helpful.  Avoid greasy, fatty, and spicy foods.  Have someone cook for you if the smell of any food causes nausea and vomiting.  If you feel nauseous after taking prenatal vitamins, take the vitamins at night or with a snack.  Eat a protein snack between meals if you are hungry. Nuts, yogurt, and cheese are good options.  Drink fluids throughout the day.  Try ginger ale made with real ginger, ginger tea made from fresh grated ginger, or ginger candies. General instructions  Do not use any products that contain nicotine or tobacco. These products include cigarettes, chewing tobacco, and vaping devices, such as e-cigarettes. If you need help quitting, ask your health care provider.  Get an air purifier to keep the air in your house free of odors.  Get plenty of fresh air.  Try to avoid odors that trigger your nausea.  Consider trying these methods to help relieve symptoms: ? Wearing an acupressure wristband. These wristbands are often worn for seasickness. ? Acupuncture. Contact a health care provider if:  Your home remedies are not working and you need medicine.  You feel dizzy or light-headed.  You are losing weight. Get help right away if:  You have persistent and uncontrolled nausea and vomiting.  You faint.  You have severe pain in your abdomen. Summary  Morning sickness is when a woman feels nauseous during pregnancy. This nauseous feeling may or may not come with vomiting.  Morning sickness is most common during the first trimester.  It often occurs in the   morning, but it can be a problem at any time of day.  In many cases, treatment is not needed for this condition. Making some changes to what you eat may help to control symptoms. This information is not intended to replace advice given to you by your health care provider. Make sure you discuss any questions you have  with your health care provider. Document Revised: 05/28/2020 Document Reviewed: 05/07/2020 Elsevier Patient Education  2021 Elsevier Inc.  
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
## 2021-02-10 LAB — MATERNIT21 PLUS CORE+SCA
Fetal Fraction: 7
Monosomy X (Turner Syndrome): NOT DETECTED
Result (T21): NEGATIVE
Trisomy 13 (Patau syndrome): NEGATIVE
Trisomy 18 (Edwards syndrome): NEGATIVE
Trisomy 21 (Down syndrome): NEGATIVE
XXX (Triple X Syndrome): NOT DETECTED
XXY (Klinefelter Syndrome): NOT DETECTED
XYY (Jacobs Syndrome): NOT DETECTED

## 2021-02-28 ENCOUNTER — Other Ambulatory Visit: Payer: Self-pay

## 2021-02-28 ENCOUNTER — Telehealth: Payer: Self-pay

## 2021-02-28 ENCOUNTER — Emergency Department
Admission: EM | Admit: 2021-02-28 | Discharge: 2021-02-28 | Disposition: A | Discharge status: Home / Self Care | Payer: Medicaid Other | Attending: Emergency Medicine | Admitting: Emergency Medicine

## 2021-02-28 DIAGNOSIS — O210 Mild hyperemesis gravidarum: Secondary | ICD-10-CM

## 2021-02-28 DIAGNOSIS — O219 Vomiting of pregnancy, unspecified: Secondary | ICD-10-CM | POA: Diagnosis present

## 2021-02-28 DIAGNOSIS — Z3A14 14 weeks gestation of pregnancy: Secondary | ICD-10-CM | POA: Insufficient documentation

## 2021-02-28 LAB — COMPREHENSIVE METABOLIC PANEL
ALT: 26 U/L (ref 0–44)
AST: 23 U/L (ref 15–41)
Albumin: 4 g/dL (ref 3.5–5.0)
Alkaline Phosphatase: 62 U/L (ref 38–126)
Anion gap: 9 (ref 5–15)
BUN: 5 mg/dL — ABNORMAL LOW (ref 6–20)
CO2: 23 mmol/L (ref 22–32)
Calcium: 9.2 mg/dL (ref 8.9–10.3)
Chloride: 102 mmol/L (ref 98–111)
Creatinine, Ser: 0.44 mg/dL (ref 0.44–1.00)
GFR, Estimated: >60 mL/min (ref >60)
Glucose, Bld: 100 mg/dL — ABNORMAL HIGH (ref 70–99)
Potassium: 3.4 mmol/L — ABNORMAL LOW (ref 3.5–5.1)
Sodium: 134 mmol/L — ABNORMAL LOW (ref 135–145)
Total Bilirubin: 0.6 mg/dL (ref 0.3–1.2)
Total Protein: 7.3 g/dL (ref 6.5–8.1)

## 2021-02-28 LAB — URINALYSIS, COMPLETE (UACMP) WITH MICROSCOPIC
Bacteria, UA: NONE SEEN
Bilirubin Urine: NEGATIVE
Glucose, UA: NEGATIVE mg/dL
Hgb urine dipstick: NEGATIVE
Ketones, ur: 80 mg/dL — AB
Leukocytes,Ua: NEGATIVE
Nitrite: NEGATIVE
Protein, ur: 30 mg/dL — AB
Specific Gravity, Urine: 1.03 (ref 1.005–1.030)
pH: 5 (ref 5.0–8.0)

## 2021-02-28 LAB — LIPASE, BLOOD: Lipase: 30 U/L (ref 11–51)

## 2021-02-28 LAB — CBC
HCT: 37.1 % (ref 36.0–46.0)
Hemoglobin: 12.6 g/dL (ref 12.0–15.0)
MCH: 26.9 pg (ref 26.0–34.0)
MCHC: 34 g/dL (ref 30.0–36.0)
MCV: 79.3 fL — ABNORMAL LOW (ref 80.0–100.0)
Platelets: 153 10*3/uL (ref 150–400)
RBC: 4.68 MIL/uL (ref 3.87–5.11)
RDW: 14.2 % (ref 11.5–15.5)
WBC: 7.2 10*3/uL (ref 4.0–10.5)
nRBC: 0 % (ref 0.0–0.2)

## 2021-02-28 LAB — HCG, QUANTITATIVE, PREGNANCY: hCG, Beta Chain, Quant, S: 53726 m[IU]/mL — ABNORMAL HIGH (ref <5)

## 2021-02-28 LAB — POC URINE PREG, ED: Preg Test, Ur: POSITIVE — AB

## 2021-02-28 MED ORDER — SODIUM CHLORIDE 0.9 % IV BOLUS
1000.0000 mL | Freq: Once | INTRAVENOUS | Status: AC
  Administered 2021-02-28: 1000 mL via INTRAVENOUS

## 2021-02-28 MED ORDER — METOCLOPRAMIDE HCL 10 MG PO TABS
10.0000 mg | ORAL_TABLET | Freq: Four times a day (QID) | ORAL | 0 refills | Status: DC | PRN
Start: 2021-02-28 — End: 2021-07-26

## 2021-02-28 MED ORDER — METOCLOPRAMIDE HCL 5 MG/ML IJ SOLN
10.0000 mg | Freq: Once | INTRAMUSCULAR | Status: AC
  Administered 2021-02-28: 10 mg via INTRAVENOUS
  Filled 2021-02-28: qty 2

## 2021-02-28 NOTE — ED Triage Notes (Addendum)
Pt is [redacted] weeks pregnant today and presents with nausea and vomiting for several weeks. Pt states that yesterday she began to develop dizziness and blurred vision. Denies abd pain/vaginal bleeding/complications with the pregnancy thus far. Pt also reports approx 14 lbs wt loss since the start of her pregnancy. Seen at PCP last month and taking OTC medications without relief.

## 2021-02-28 NOTE — Telephone Encounter (Signed)
Pt was adv to go to ED.
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
## 2021-02-28 NOTE — Telephone Encounter (Signed)
Pt calling; 14wks; can't keep anything down - food or liquid; gets lightheaded and has blurry vision every time she throws up; is weak.  Should she to go the hosp, not do anything, what to do.  [phone] [facility]

## 2021-03-18 ENCOUNTER — Encounter: Payer: Self-pay | Admitting: Advanced Practice Midwife

## 2021-03-18 ENCOUNTER — Ambulatory Visit (INDEPENDENT_AMBULATORY_CARE_PROVIDER_SITE_OTHER): Payer: Medicaid Other | Admitting: Advanced Practice Midwife

## 2021-03-18 ENCOUNTER — Other Ambulatory Visit: Payer: Self-pay

## 2021-03-18 VITALS — BP 118/68 | HR 82 | Wt 200.4 lb

## 2021-03-18 DIAGNOSIS — Z369 Encounter for antenatal screening, unspecified: Secondary | ICD-10-CM

## 2021-03-18 DIAGNOSIS — O99212 Obesity complicating pregnancy, second trimester: Secondary | ICD-10-CM

## 2021-03-18 DIAGNOSIS — O210 Mild hyperemesis gravidarum: Secondary | ICD-10-CM | POA: Insufficient documentation

## 2021-03-18 DIAGNOSIS — Z3482 Encounter for supervision of other normal pregnancy, second trimester: Secondary | ICD-10-CM

## 2021-03-18 DIAGNOSIS — Z3A16 16 weeks gestation of pregnancy: Secondary | ICD-10-CM

## 2021-03-18 DIAGNOSIS — O0992 Supervision of high risk pregnancy, unspecified, second trimester: Secondary | ICD-10-CM

## 2021-03-18 LAB — POCT URINALYSIS DIPSTICK OB
Glucose, UA: NEGATIVE
POC,PROTEIN,UA: NEGATIVE
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
## 2021-04-25 ENCOUNTER — Ambulatory Visit
Admission: RE | Admit: 2021-04-25 | Discharge: 2021-04-25 | Disposition: A | Discharge status: Home / Self Care | Payer: Medicaid Other | Source: Ambulatory Visit | Attending: Advanced Practice Midwife | Admitting: Advanced Practice Midwife

## 2021-04-25 ENCOUNTER — Other Ambulatory Visit: Payer: Self-pay

## 2021-04-25 DIAGNOSIS — Z3482 Encounter for supervision of other normal pregnancy, second trimester: Secondary | ICD-10-CM | POA: Insufficient documentation

## 2021-04-25 DIAGNOSIS — Z369 Encounter for antenatal screening, unspecified: Secondary | ICD-10-CM | POA: Diagnosis present

## 2021-04-25 DIAGNOSIS — Z3A21 21 weeks gestation of pregnancy: Secondary | ICD-10-CM | POA: Diagnosis not present

## 2021-04-25 DIAGNOSIS — O321XX0 Maternal care for breech presentation, not applicable or unspecified: Secondary | ICD-10-CM | POA: Diagnosis not present

## 2021-04-26 ENCOUNTER — Encounter: Payer: Medicaid Other | Admitting: Obstetrics

## 2021-05-02 ENCOUNTER — Encounter: Payer: Medicaid Other | Admitting: Advanced Practice Midwife

## 2021-06-10 ENCOUNTER — Telehealth: Payer: Self-pay

## 2021-06-10 NOTE — Telephone Encounter (Signed)
Pt calling; is ~29wks; having SOB, chest pain and dizziness and abd pain; what to do? Is this normal?  [phone]  Adv pt per protocol to go to ED as they can evaluate her better than we can for this.

## 2021-06-18 ENCOUNTER — Other Ambulatory Visit: Payer: Self-pay

## 2021-06-18 ENCOUNTER — Ambulatory Visit (INDEPENDENT_AMBULATORY_CARE_PROVIDER_SITE_OTHER): Payer: Medicaid Other | Admitting: Advanced Practice Midwife

## 2021-06-18 VITALS — BP 114/70 | Wt 210.0 lb

## 2021-06-18 DIAGNOSIS — O99213 Obesity complicating pregnancy, third trimester: Secondary | ICD-10-CM

## 2021-06-18 DIAGNOSIS — O0991 Supervision of high risk pregnancy, unspecified, first trimester: Secondary | ICD-10-CM | POA: Diagnosis not present

## 2021-06-18 DIAGNOSIS — Z3A29 29 weeks gestation of pregnancy: Secondary | ICD-10-CM

## 2021-06-18 DIAGNOSIS — Z369 Encounter for antenatal screening, unspecified: Secondary | ICD-10-CM

## 2021-06-18 DIAGNOSIS — O0993 Supervision of high risk pregnancy, unspecified, third trimester: Secondary | ICD-10-CM

## 2021-06-18 DIAGNOSIS — O99211 Obesity complicating pregnancy, first trimester: Secondary | ICD-10-CM

## 2021-06-18 DIAGNOSIS — Z113 Encounter for screening for infections with a predominantly sexual mode of transmission: Secondary | ICD-10-CM

## 2021-06-18 LAB — POCT URINALYSIS DIPSTICK OB
Glucose, UA: NEGATIVE
POC,PROTEIN,UA: NEGATIVE

## 2021-06-18 LAB — OB RESULTS CONSOLE RPR: RPR: NONREACTIVE

## 2021-06-18 LAB — OB RESULTS CONSOLE HIV ANTIBODY (ROUTINE TESTING): HIV: NONREACTIVE

## 2021-06-18 LAB — OB RESULTS CONSOLE VARICELLA ZOSTER ANTIBODY, IGG: Varicella: IMMUNE

## 2021-06-18 LAB — OB RESULTS CONSOLE RUBELLA ANTIBODY, IGM: Rubella: IMMUNE

## 2021-06-18 LAB — OB RESULTS CONSOLE HEPATITIS B SURFACE ANTIGEN: Hepatitis B Surface Ag: NEGATIVE
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
## 2021-06-19 LAB — RPR+RH+ABO+RUB AB+AB SCR+CB...
Antibody Screen: NEGATIVE
HIV Screen 4th Generation wRfx: NONREACTIVE
Hematocrit: 34.9 % (ref 34.0–46.6)
Hemoglobin: 11.5 g/dL (ref 11.1–15.9)
Hepatitis B Surface Ag: NEGATIVE
MCH: 27 pg (ref 26.6–33.0)
MCHC: 33 g/dL (ref 31.5–35.7)
MCV: 82 fL (ref 79–97)
Platelets: 138 10*3/uL — ABNORMAL LOW (ref 150–450)
RBC: 4.26 x10E6/uL (ref 3.77–5.28)
RDW: 12.9 % (ref 11.7–15.4)
RPR Ser Ql: NONREACTIVE
Rh Factor: POSITIVE
Rubella Antibodies, IGG: 3.22 index (ref >0.99)
Varicella zoster IgG: 988 index (ref >165)
WBC: 6.9 10*3/uL (ref 3.4–10.8)

## 2021-06-19 LAB — GLUCOSE, 1 HOUR GESTATIONAL: Gestational Diabetes Screen: 80 mg/dL (ref 65–139)

## 2021-07-03 ENCOUNTER — Encounter: Payer: Medicaid Other | Admitting: Advanced Practice Midwife

## 2021-07-08 ENCOUNTER — Encounter: Payer: Medicaid Other | Admitting: Advanced Practice Midwife

## 2021-07-18 ENCOUNTER — Other Ambulatory Visit: Payer: Self-pay

## 2021-07-18 ENCOUNTER — Ambulatory Visit (INDEPENDENT_AMBULATORY_CARE_PROVIDER_SITE_OTHER): Payer: Medicaid Other | Admitting: Obstetrics and Gynecology

## 2021-07-18 VITALS — BP 123/75 | Wt 215.0 lb

## 2021-07-18 DIAGNOSIS — O0990 Supervision of high risk pregnancy, unspecified, unspecified trimester: Secondary | ICD-10-CM

## 2021-07-18 DIAGNOSIS — Z3A34 34 weeks gestation of pregnancy: Secondary | ICD-10-CM

## 2021-07-18 DIAGNOSIS — O99213 Obesity complicating pregnancy, third trimester: Secondary | ICD-10-CM

## 2021-07-18 LAB — POCT URINALYSIS DIPSTICK OB
Glucose, UA: NEGATIVE
POC,PROTEIN,UA: NEGATIVE
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
## 2021-07-18 NOTE — Progress Notes (Signed)
ROB - no concerns. RM 2 

## 2021-07-22 ENCOUNTER — Telehealth: Payer: Self-pay

## 2021-07-22 NOTE — Telephone Encounter (Signed)
Pt states the back pain was constant; it felt like back labor; only the top of her abd was getting tight; adv B-H; took tylenol and stood in hot shower which relieved pain enough that she was finally able to get some sleep; GFM.  Adv e.s. tylenol two every six hours; apply heat for each hour but do not sleep with heating pad; pt states she prefers showers over heating pad b/c they make her itch. Cautioned pt not to get the shower so hot that it makes her lightheaded, dizzy.  Adv pt if things got so bad she wasn't comfortable being home to go to L&D.

## 2021-07-22 NOTE — Telephone Encounter (Signed)
Pt calling trying to get an appt; last HS at 11pm had severe back pain and it didn't ease up until 10am today; called L&D and was adv to call us.  [phone] [facility]

## 2021-07-26 ENCOUNTER — Encounter: Payer: Self-pay | Admitting: Advanced Practice Midwife

## 2021-07-26 ENCOUNTER — Other Ambulatory Visit: Payer: Self-pay

## 2021-07-26 ENCOUNTER — Telehealth: Payer: Self-pay

## 2021-07-26 ENCOUNTER — Ambulatory Visit (INDEPENDENT_AMBULATORY_CARE_PROVIDER_SITE_OTHER): Payer: Medicaid Other | Admitting: Advanced Practice Midwife

## 2021-07-26 VITALS — BP 120/70 | Wt 218.0 lb

## 2021-07-26 DIAGNOSIS — O0993 Supervision of high risk pregnancy, unspecified, third trimester: Secondary | ICD-10-CM

## 2021-07-26 DIAGNOSIS — O99213 Obesity complicating pregnancy, third trimester: Secondary | ICD-10-CM

## 2021-07-26 LAB — POCT URINALYSIS DIPSTICK OB
Glucose, UA: NEGATIVE
POC,PROTEIN,UA: NEGATIVE
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
## 2021-07-26 NOTE — Telephone Encounter (Signed)
Pt calling; is [redacted]w[redacted]d; woke up from nap water running down her leg.  [phone]  Pt states is having B-H ctxs in middle of stomach.  WI c JEG; told to be here at 3:30.

## 2021-07-26 NOTE — Progress Notes (Signed)
C/o leaking fluid.rj

## 2021-08-02 ENCOUNTER — Encounter: Payer: Self-pay | Admitting: Advanced Practice Midwife

## 2021-08-02 ENCOUNTER — Other Ambulatory Visit (HOSPITAL_COMMUNITY)
Admission: RE | Admit: 2021-08-02 | Discharge: 2021-08-02 | Disposition: A | Discharge status: Home / Self Care | Payer: Medicaid Other | Source: Ambulatory Visit | Attending: Advanced Practice Midwife | Admitting: Advanced Practice Midwife

## 2021-08-02 ENCOUNTER — Other Ambulatory Visit: Payer: Self-pay

## 2021-08-02 ENCOUNTER — Ambulatory Visit (INDEPENDENT_AMBULATORY_CARE_PROVIDER_SITE_OTHER): Payer: Medicaid Other | Admitting: Advanced Practice Midwife

## 2021-08-02 VITALS — BP 120/80 | Wt 218.0 lb

## 2021-08-02 DIAGNOSIS — Z113 Encounter for screening for infections with a predominantly sexual mode of transmission: Secondary | ICD-10-CM | POA: Diagnosis present

## 2021-08-02 DIAGNOSIS — A369 Diphtheria, unspecified: Secondary | ICD-10-CM | POA: Diagnosis present

## 2021-08-02 DIAGNOSIS — Z369 Encounter for antenatal screening, unspecified: Secondary | ICD-10-CM | POA: Diagnosis not present

## 2021-08-02 DIAGNOSIS — Z3A36 36 weeks gestation of pregnancy: Secondary | ICD-10-CM | POA: Insufficient documentation

## 2021-08-02 DIAGNOSIS — Z3685 Encounter for antenatal screening for Streptococcus B: Secondary | ICD-10-CM | POA: Diagnosis not present

## 2021-08-02 DIAGNOSIS — O99213 Obesity complicating pregnancy, third trimester: Secondary | ICD-10-CM

## 2021-08-02 DIAGNOSIS — O0990 Supervision of high risk pregnancy, unspecified, unspecified trimester: Secondary | ICD-10-CM

## 2021-08-02 LAB — OB RESULTS CONSOLE GC/CHLAMYDIA
Chlamydia: NEGATIVE
Gonorrhea: NEGATIVE

## 2021-08-02 LAB — OB RESULTS CONSOLE GBS: GBS: NEGATIVE
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
## 2021-08-04 LAB — STREP GP B NAA: Strep Gp B NAA: NEGATIVE

## 2021-08-05 LAB — CERVICOVAGINAL ANCILLARY ONLY
Chlamydia: NEGATIVE
Comment: NEGATIVE
Comment: NEGATIVE
Comment: NORMAL
Neisseria Gonorrhea: NEGATIVE
Trichomonas: NEGATIVE

## 2021-08-07 ENCOUNTER — Encounter: Payer: Medicaid Other | Admitting: Advanced Practice Midwife

## 2021-08-15 ENCOUNTER — Encounter: Payer: Self-pay | Admitting: Advanced Practice Midwife

## 2021-08-15 ENCOUNTER — Other Ambulatory Visit: Payer: Self-pay

## 2021-08-15 ENCOUNTER — Ambulatory Visit (INDEPENDENT_AMBULATORY_CARE_PROVIDER_SITE_OTHER): Payer: Medicaid Other | Admitting: Advanced Practice Midwife

## 2021-08-15 VITALS — BP 120/72 | Wt 220.0 lb

## 2021-08-15 DIAGNOSIS — O0993 Supervision of high risk pregnancy, unspecified, third trimester: Secondary | ICD-10-CM

## 2021-08-15 DIAGNOSIS — Z3A38 38 weeks gestation of pregnancy: Secondary | ICD-10-CM

## 2021-08-15 DIAGNOSIS — O99213 Obesity complicating pregnancy, third trimester: Secondary | ICD-10-CM

## 2021-08-15 LAB — POCT URINALYSIS DIPSTICK OB
Glucose, UA: NEGATIVE
POC,PROTEIN,UA: NEGATIVE

## 2021-08-15 NOTE — Progress Notes (Signed)
Declines cervix check today
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
## 2021-08-21 ENCOUNTER — Encounter: Payer: Self-pay | Admitting: Advanced Practice Midwife

## 2021-08-21 ENCOUNTER — Ambulatory Visit (INDEPENDENT_AMBULATORY_CARE_PROVIDER_SITE_OTHER): Payer: Medicaid Other | Admitting: Advanced Practice Midwife

## 2021-08-21 ENCOUNTER — Other Ambulatory Visit: Payer: Self-pay

## 2021-08-21 VITALS — BP 110/60 | Wt 222.0 lb

## 2021-08-21 DIAGNOSIS — Z3A38 38 weeks gestation of pregnancy: Secondary | ICD-10-CM

## 2021-08-21 DIAGNOSIS — Z3490 Encounter for supervision of normal pregnancy, unspecified, unspecified trimester: Secondary | ICD-10-CM

## 2021-08-21 DIAGNOSIS — O0990 Supervision of high risk pregnancy, unspecified, unspecified trimester: Secondary | ICD-10-CM

## 2021-08-21 DIAGNOSIS — O99213 Obesity complicating pregnancy, third trimester: Secondary | ICD-10-CM

## 2021-08-21 LAB — POCT URINALYSIS DIPSTICK OB
Glucose, UA: NEGATIVE
POC,PROTEIN,UA: NEGATIVE
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
## 2021-08-26 ENCOUNTER — Other Ambulatory Visit: Payer: Self-pay

## 2021-08-26 ENCOUNTER — Other Ambulatory Visit
Admission: RE | Admit: 2021-08-26 | Discharge: 2021-08-26 | Disposition: A | Discharge status: Home / Self Care | Payer: Medicaid Other | Source: Ambulatory Visit | Attending: Advanced Practice Midwife | Admitting: Advanced Practice Midwife

## 2021-08-26 DIAGNOSIS — Z01812 Encounter for preprocedural laboratory examination: Secondary | ICD-10-CM | POA: Diagnosis not present

## 2021-08-26 DIAGNOSIS — Z20822 Contact with and (suspected) exposure to covid-19: Secondary | ICD-10-CM | POA: Diagnosis not present

## 2021-08-27 LAB — SARS CORONAVIRUS 2 (TAT 6-24 HRS): SARS Coronavirus 2: NEGATIVE

## 2021-08-29 ENCOUNTER — Inpatient Hospital Stay
Admission: RE | Admit: 2021-08-29 | Discharge: 2021-08-31 | DRG: 798 | Disposition: A | Discharge status: Home / Self Care | Payer: Medicaid Other | Attending: Obstetrics & Gynecology | Admitting: Obstetrics & Gynecology

## 2021-08-29 ENCOUNTER — Encounter: Payer: Self-pay | Admitting: Advanced Practice Midwife

## 2021-08-29 ENCOUNTER — Other Ambulatory Visit: Payer: Self-pay

## 2021-08-29 DIAGNOSIS — Z3490 Encounter for supervision of normal pregnancy, unspecified, unspecified trimester: Secondary | ICD-10-CM | POA: Diagnosis present

## 2021-08-29 DIAGNOSIS — O480 Post-term pregnancy: Principal | ICD-10-CM

## 2021-08-29 DIAGNOSIS — Z302 Encounter for sterilization: Secondary | ICD-10-CM | POA: Diagnosis not present

## 2021-08-29 DIAGNOSIS — Z3A40 40 weeks gestation of pregnancy: Secondary | ICD-10-CM | POA: Diagnosis not present

## 2021-08-29 DIAGNOSIS — O99213 Obesity complicating pregnancy, third trimester: Secondary | ICD-10-CM

## 2021-08-29 DIAGNOSIS — O210 Mild hyperemesis gravidarum: Secondary | ICD-10-CM

## 2021-08-29 DIAGNOSIS — O0990 Supervision of high risk pregnancy, unspecified, unspecified trimester: Secondary | ICD-10-CM

## 2021-08-29 LAB — CBC
HCT: 34.7 % — ABNORMAL LOW (ref 36.0–46.0)
Hemoglobin: 11.1 g/dL — ABNORMAL LOW (ref 12.0–15.0)
MCH: 24.4 pg — ABNORMAL LOW (ref 26.0–34.0)
MCHC: 32 g/dL (ref 30.0–36.0)
MCV: 76.3 fL — ABNORMAL LOW (ref 80.0–100.0)
Platelets: 121 10*3/uL — ABNORMAL LOW (ref 150–400)
RBC: 4.55 MIL/uL (ref 3.87–5.11)
RDW: 15.1 % (ref 11.5–15.5)
WBC: 8 10*3/uL (ref 4.0–10.5)
nRBC: 0 % (ref 0.0–0.2)

## 2021-08-29 LAB — TYPE AND SCREEN
ABO/RH(D): O POS
Antibody Screen: NEGATIVE

## 2021-08-29 MED ORDER — LACTATED RINGERS IV SOLN: INTRAVENOUS | Status: DC

## 2021-08-29 MED ORDER — ACETAMINOPHEN 325 MG PO TABS: 650.0000 mg | ORAL_TABLET | ORAL | Status: DC | PRN

## 2021-08-29 MED ORDER — TERBUTALINE SULFATE 1 MG/ML IJ SOLN: 0.2500 mg | Freq: Once | INTRAMUSCULAR | Status: DC | PRN

## 2021-08-29 MED ORDER — LIDOCAINE HCL (PF) 1 % IJ SOLN: 30.0000 mL | INTRAMUSCULAR | Status: DC | PRN

## 2021-08-29 MED ORDER — LACTATED RINGERS IV SOLN: 500.0000 mL | INTRAVENOUS | Status: DC | PRN

## 2021-08-29 MED ORDER — OXYTOCIN BOLUS FROM INFUSION
333.0000 mL | Freq: Once | INTRAVENOUS | Status: AC
  Administered 2021-08-30: 333 mL via INTRAVENOUS

## 2021-08-29 MED ORDER — MISOPROSTOL 25 MCG QUARTER TABLET
25.0000 ug | ORAL_TABLET | ORAL | Status: DC | PRN
  Administered 2021-08-29: 25 ug via VAGINAL
  Filled 2021-08-29: qty 1

## 2021-08-29 MED ORDER — BUTORPHANOL TARTRATE 1 MG/ML IJ SOLN: 1.0000 mg | INTRAMUSCULAR | Status: DC | PRN

## 2021-08-29 MED ORDER — ONDANSETRON HCL 4 MG/2ML IJ SOLN: 4.0000 mg | Freq: Four times a day (QID) | INTRAMUSCULAR | Status: DC | PRN

## 2021-08-29 MED ORDER — OXYTOCIN-SODIUM CHLORIDE 30-0.9 UT/500ML-% IV SOLN
2.5000 [IU]/h | INTRAVENOUS | Status: DC
  Administered 2021-08-30: 2.5 [IU]/h via INTRAVENOUS

## 2021-08-29 MED ORDER — OXYTOCIN-SODIUM CHLORIDE 30-0.9 UT/500ML-% IV SOLN
1.0000 m[IU]/min | INTRAVENOUS | Status: DC
  Administered 2021-08-29: 2 m[IU]/min via INTRAVENOUS
  Filled 2021-08-29: qty 1000

## 2021-08-29 NOTE — Progress Notes (Signed)
  Labor Progress Note   30 y.o. [id number] @ [redacted]w[redacted]d , admitted for  Pregnancy, Labor Management.   Subjective:  Min pain One dose Cytotec  Now on Pitocin  Objective:  BP 128/79   Pulse 85   Temp 98 F (36.7 C) (Oral)   Resp 18   Ht 5\' 9"  (1.753 m)   Wt 99.8 kg   LMP 11/10/2020 (Within Days)   SpO2 100%   BMI 32.49 kg/m  Abd: gravid, ND, FHT present, mild tenderness on exam Extr: trace to 1+ bilateral pedal edema SVE: 2/70/-3  EFM: FHR: 140 bpm, variability: moderate,  accelerations:  Present,  decelerations:  Absent Toco: irreg Labs: I have reviewed the patient's lab results.   Assessment & Plan:  11/12/2020 @ [redacted]w[redacted]d, admitted for  Pregnancy and Labor/Delivery Management  1. Pain management: none. 2. FWB: FHT category 1.  3. ID: GBS negative 4. Labor management: Cont Pitocin AROM and epidural as appropriate  All discussed with patient, see orders  [redacted]w[redacted]d, MD, Annamarie Major Ob/Gyn, [facility] 08/29/2021  2:47 PM
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
## 2021-08-30 ENCOUNTER — Inpatient Hospital Stay: Payer: Medicaid Other | Admitting: Anesthesiology

## 2021-08-30 ENCOUNTER — Encounter: Payer: Self-pay | Admitting: Obstetrics & Gynecology

## 2021-08-30 LAB — RPR: RPR Ser Ql: NONREACTIVE

## 2021-08-30 MED ORDER — SODIUM CHLORIDE 0.9% FLUSH
3.0000 mL | Freq: Two times a day (BID) | INTRAVENOUS | Status: DC
  Administered 2021-08-30 (×2): 3 mL via INTRAVENOUS

## 2021-08-30 MED ORDER — DIPHENHYDRAMINE HCL 25 MG PO CAPS: 25.0000 mg | ORAL_CAPSULE | Freq: Four times a day (QID) | ORAL | Status: DC | PRN

## 2021-08-30 MED ORDER — SENNOSIDES-DOCUSATE SODIUM 8.6-50 MG PO TABS
2.0000 | ORAL_TABLET | ORAL | Status: DC
  Administered 2021-08-30 – 2021-08-31 (×2): 2 via ORAL
  Filled 2021-08-30 (×2): qty 2

## 2021-08-30 MED ORDER — ACETAMINOPHEN 325 MG PO TABS
650.0000 mg | ORAL_TABLET | ORAL | Status: DC | PRN
  Administered 2021-08-30 – 2021-08-31 (×3): 650 mg via ORAL
  Filled 2021-08-30 (×3): qty 2

## 2021-08-30 MED ORDER — DIPHENHYDRAMINE HCL 50 MG/ML IJ SOLN: 12.5000 mg | INTRAMUSCULAR | Status: DC | PRN

## 2021-08-30 MED ORDER — SODIUM CHLORIDE 0.9 % IV SOLN
INTRAVENOUS | Status: DC | PRN
  Administered 2021-08-30 (×2): 5 mL via EPIDURAL

## 2021-08-30 MED ORDER — SIMETHICONE 80 MG PO CHEW: 80.0000 mg | CHEWABLE_TABLET | ORAL | Status: DC | PRN

## 2021-08-30 MED ORDER — LIDOCAINE HCL (PF) 1 % IJ SOLN
INTRAMUSCULAR | Status: DC | PRN
  Administered 2021-08-30: 3 mL via SUBCUTANEOUS

## 2021-08-30 MED ORDER — PHENYLEPHRINE 40 MCG/ML (10ML) SYRINGE FOR IV PUSH (FOR BLOOD PRESSURE SUPPORT): 80.0000 ug | PREFILLED_SYRINGE | INTRAVENOUS | Status: DC | PRN

## 2021-08-30 MED ORDER — LACTATED RINGERS IV SOLN
500.0000 mL | Freq: Once | INTRAVENOUS | Status: AC
  Administered 2021-08-30: 500 mL via INTRAVENOUS

## 2021-08-30 MED ORDER — FENTANYL-BUPIVACAINE-NACL 0.5-0.125-0.9 MG/250ML-% EP SOLN
EPIDURAL | Status: AC
  Filled 2021-08-30: qty 250

## 2021-08-30 MED ORDER — POVIDONE-IODINE 10 % EX SWAB: 2.0000 "application " | Freq: Once | CUTANEOUS | Status: DC

## 2021-08-30 MED ORDER — FENTANYL-BUPIVACAINE-NACL 0.5-0.125-0.9 MG/250ML-% EP SOLN: 12.0000 mL/h | EPIDURAL | Status: DC | PRN

## 2021-08-30 MED ORDER — EPHEDRINE 5 MG/ML INJ: 10.0000 mg | INTRAVENOUS | Status: DC | PRN

## 2021-08-30 MED ORDER — FENTANYL-BUPIVACAINE-NACL 0.5-0.125-0.9 MG/250ML-% EP SOLN
EPIDURAL | Status: DC | PRN
  Administered 2021-08-30: 12 mL/h via EPIDURAL

## 2021-08-30 MED ORDER — ZOLPIDEM TARTRATE 5 MG PO TABS: 5.0000 mg | ORAL_TABLET | Freq: Every evening | ORAL | Status: DC | PRN

## 2021-08-30 MED ORDER — SODIUM CHLORIDE 0.9 % IV SOLN
250.0000 mL | INTRAVENOUS | Status: DC | PRN
  Administered 2021-08-31: 250 mL via INTRAVENOUS

## 2021-08-30 MED ORDER — ONDANSETRON HCL 4 MG/2ML IJ SOLN: 4.0000 mg | INTRAMUSCULAR | Status: DC | PRN

## 2021-08-30 MED ORDER — WITCH HAZEL-GLYCERIN EX PADS: 1.0000 "application " | MEDICATED_PAD | CUTANEOUS | Status: DC | PRN

## 2021-08-30 MED ORDER — COCONUT OIL OIL
1.0000 "application " | TOPICAL_OIL | Status: DC | PRN
  Filled 2021-08-30: qty 120

## 2021-08-30 MED ORDER — LIDOCAINE-EPINEPHRINE (PF) 1.5 %-1:200000 IJ SOLN
INTRAMUSCULAR | Status: DC | PRN
  Administered 2021-08-30: 3 mL via EPIDURAL

## 2021-08-30 MED ORDER — DIBUCAINE (PERIANAL) 1 % EX OINT: 1.0000 "application " | TOPICAL_OINTMENT | CUTANEOUS | Status: DC | PRN

## 2021-08-30 MED ORDER — ONDANSETRON HCL 4 MG PO TABS
4.0000 mg | ORAL_TABLET | ORAL | Status: DC | PRN
  Filled 2021-08-30: qty 1

## 2021-08-30 MED ORDER — IBUPROFEN 600 MG PO TABS
600.0000 mg | ORAL_TABLET | Freq: Four times a day (QID) | ORAL | Status: DC
  Administered 2021-08-30 – 2021-08-31 (×7): 600 mg via ORAL
  Filled 2021-08-30 (×7): qty 1

## 2021-08-30 MED ORDER — BENZOCAINE-MENTHOL 20-0.5 % EX AERO
1.0000 "application " | INHALATION_SPRAY | CUTANEOUS | Status: DC | PRN
  Administered 2021-08-30: 1 via TOPICAL
  Filled 2021-08-30: qty 56

## 2021-08-30 MED ORDER — SODIUM CHLORIDE 0.9% FLUSH: 3.0000 mL | INTRAVENOUS | Status: DC | PRN

## 2021-08-30 NOTE — Discharge Summary (Signed)
Postpartum Discharge Summary  Date of Service updated11/5     Patient Name: Charlene Smith DOB: Mar 04, 1991 MRN: [id number]  Date of admission: 08/29/2021 Delivery date:08/30/2021  Delivering provider: Gae Dry  Date of discharge: 08/31/2021  Admitting diagnosis: Encounter for elective induction of labor [Z34.90] Intrauterine pregnancy: [redacted]w[redacted]d    Secondary diagnosis:  Active Problems:   Encounter for elective induction of labor   Admission for sterilization  Additional problems: none    Discharge diagnosis: Term Pregnancy Delivered                                              Post partum procedures:postpartum tubal ligation Augmentation: AROM, Pitocin, and Cytotec Complications: None  Hospital course: Induction of Labor With Vaginal Delivery   30y.o. yo [id number] admitted to the hospital 08/29/2021 for induction of labor.  Indication for induction: Postdates and Favorable cervix at term.  Patient had an uncomplicated labor course as follows: Membrane Rupture Time/Date: 1:25 AM ,08/30/2021   Delivery Method:Vaginal, Spontaneous  Episiotomy: None  Lacerations:  None  Details of delivery can be found in separate delivery note.  Patient had a routine postpartum course. Patient is discharged home 08/31/21.  Newborn Data: Birth date:08/30/2021  Birth time:5:36 AM  Gender:Female  Living status:Living  Apgars:8 ,9  Weight:3800 g   Magnesium Sulfate received: No BMZ received: No Rhophylac:No MMR:No T-DaP:Given prenatally Flu: No Transfusion:No  Physical exam  Vitals:   08/31/21 1028 08/31/21 1045 08/31/21 1220 08/31/21 1638  BP: 108/69 113/77 123/79 128/80  Pulse: 88 83 71 83  Resp: '18 17 18 18  ' Temp:  (!) 97.4 F (36.3 C)  98 F (36.7 C)  TempSrc:  Oral  Oral  SpO2: 98% 100% 99% 99%  Weight:      Height:       General: alert, cooperative, and no distress Lochia: appropriate Uterine Fundus: firm Incision: Healing well with no significant  drainage DVT Evaluation: No evidence of DVT seen on physical exam. Labs: Lab Results  Component Value Date   WBC 8.5 08/31/2021   HGB 9.6 (L) 08/31/2021   HCT 30.0 (L) 08/31/2021   MCV 77.5 (L) 08/31/2021   PLT 121 (L) 08/31/2021   CMP Latest Ref Rng & Units 02/28/2021  Glucose 70 - 99 mg/dL 100(H)  BUN 6 - 20 mg/dL 5(L)  Creatinine 0.44 - 1.00 mg/dL 0.44  Sodium 135 - 145 mmol/L 134(L)  Potassium 3.5 - 5.1 mmol/L 3.4(L)  Chloride 98 - 111 mmol/L 102  CO2 22 - 32 mmol/L 23  Calcium 8.9 - 10.3 mg/dL 9.2  Total Protein 6.5 - 8.1 g/dL 7.3  Total Bilirubin 0.3 - 1.2 mg/dL 0.6  Alkaline Phos 38 - 126 U/L 62  AST 15 - 41 U/L 23  ALT 0 - 44 U/L 26   Edinburgh Score: Edinburgh Postnatal Depression Scale Screening Tool 08/30/2021  I have been able to laugh and see the funny side of things. 0  I have looked forward with enjoyment to things. 0  I have blamed myself unnecessarily when things went wrong. 0  I have been anxious or worried for no good reason. 0  I have felt scared or panicky for no good reason. 0  Things have been getting on top of me. 0  I have been so unhappy that I have had difficulty sleeping.  0  I have felt sad or miserable. 0  I have been so unhappy that I have been crying. 0  The thought of harming myself has occurred to me. 0  Edinburgh Postnatal Depression Scale Total 0      After visit meds:  Allergies as of 08/31/2021   No Known Allergies      Medication List     TAKE these medications    oxyCODONE-acetaminophen 5-325 MG tablet Commonly known as: Percocet Take 1 tablet by mouth every 4 (four) hours as needed for severe pain.   PRENATAL VITAMIN PO Take 2 tablets by mouth every morning.               Discharge Care Instructions  (From admission, onward)           Start     Ordered   08/31/21 0000  If the dressing is still on your incision site when you go home, remove it on the third day after your surgery date. Remove dressing if it  begins to fall off, or if it is dirty or damaged before the third day.        08/31/21 1739             Discharge home in stable condition Infant Feeding: Breast Infant Disposition:home with mother Discharge instruction: per After Visit Summary and Postpartum booklet. Activity: Advance as tolerated. Pelvic rest for 6 weeks.  Diet: routine diet Anticipated Birth Control: BTL done PP Postpartum Appointment:6 weeks Additional Postpartum F/U:  prn Future Appointments:No future appointments. Follow up Visit:  Follow-up Information     Gae Dry, MD. Schedule an appointment as soon as possible for a visit in 6 week(s).   Specialty: Obstetrics and Gynecology Why: For Post Partum Check Contact information: [address] [place] [address] [phone]                     08/31/2021 Hoyt Koch, MD
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
## 2021-08-30 NOTE — Anesthesia Procedure Notes (Signed)
Epidural Patient location during procedure: OB Start time: 08/30/2021 2:28 AM End time: 08/30/2021 2:50 AM  Staffing Anesthesiologist: Lenard Simmer, MD Performed: anesthesiologist   Preanesthetic Checklist Completed: patient identified, IV checked, site marked, risks and benefits discussed, surgical consent, monitors and equipment checked, pre-op evaluation and timeout performed  Epidural Patient position: sitting Prep: ChloraPrep Patient monitoring: heart rate, continuous pulse ox and blood pressure Approach: midline Location: L3-L4 Injection technique: LOR saline  Needle:  Needle type: Tuohy  Needle gauge: 17 G Needle length: 9 cm and 9 Needle insertion depth: 5 cm Catheter type: closed end flexible Catheter size: 19 Gauge Catheter at skin depth: 10 cm Test dose: negative and 1.5% lidocaine with Epi 1:200 K  Assessment Sensory level: T10 Events: blood not aspirated, injection not painful, no injection resistance, no paresthesia and negative IV test  Additional Notes 1st attempt Pt. Evaluated and documentation done after procedure finished. Patient identified. Risks/Benefits/Options discussed with patient including but not limited to bleeding, infection, nerve damage, paralysis, failed block, incomplete pain control, headache, blood pressure changes, nausea, vomiting, reactions to medication both or allergic, itching and postpartum back pain. Confirmed with bedside nurse the patient's most recent platelet count. Confirmed with patient that they are not currently taking any anticoagulation, have any bleeding history or any family history of bleeding disorders. Patient expressed understanding and wished to proceed. All questions were answered. Sterile technique was used throughout the entire procedure. Please see nursing notes for vital signs. Test dose was given through epidural catheter and negative prior to continuing to dose epidural or start infusion. Warning signs of high  block given to the patient including shortness of breath, tingling/numbness in hands, complete motor block, or any concerning symptoms with instructions to call for help. Patient was given instructions on fall risk and not to get out of bed. All questions and concerns addressed with instructions to call with any issues or inadequate analgesia.    Patient tolerated the insertion well without immediate complications.Reason for block:procedure for pain

## 2021-08-30 NOTE — Progress Notes (Signed)
Post Partum Day 0 Subjective: no complaints, voiding, tolerating PO, and she admits that she has not slept since her delivery.she verbalizes pleasure with her labor experience.  Objective: Blood pressure 108/66, pulse 68, temperature 98.3 F (36.8 C), temperature source Oral, resp. rate 18, height 5\' 9"  (1.753 m), weight 99.8 kg, last menstrual period 11/10/2020, SpO2 99 %, unknown if currently breastfeeding.  Physical Exam:  General: cooperative, fatigued, no distress, and mildly obese Lochia: appropriate Uterine Fundus: firm Incision: intact perineum DVT Evaluation: No evidence of DVT seen on physical exam. Negative Homan's sign.  Recent Labs    08/29/21 0839  HGB 11.1*  HCT 34.7*    Assessment/Plan: Breastfeeding and Lactation consult Continue postpartum orders.   LOS: 1 day   13/03/22 08/30/2021, 8:30 AM

## 2021-08-30 NOTE — Lactation Note (Signed)
This note was copied from a baby's chart. Lactation Consultation Note  Patient Name: Charlene Smith GYBWL'S Date: 08/30/2021 Reason for consult: Initial assessment;Term Age:28 hours  Maternal Data Does the patient have breastfeeding experience prior to this delivery?: Yes How long did the patient breastfeed?: 1-2 mths  Feeding Mother's Current Feeding Choice: Breast Milk Baby latched and nursing on left breast in cradle hold while mom eating her dinner, occ stimulation needed to continue with nursing, when comes off breast, baby latches back easily, mom has no questions or concerns at present   [facility] Score Latch: Grasps breast easily, tongue down, lips flanged, rhythmical sucking.  Audible Swallowing: A few with stimulation  Type of Nipple: Everted at rest and after stimulation  Comfort (Breast/Nipple): Soft / non-tender  Hold (Positioning): No assistance needed to correctly position infant at breast.  LATCH Score: 9   Lactation Tools Discussed/Used  LC name and no written on white board  Interventions Interventions: Breast feeding basics reviewed;Education  Discharge Pump: Personal WIC Program: Yes  Consult Status Consult Status: PRN    Dyann Kief 08/30/2021, 5:54 PM

## 2021-08-30 NOTE — Progress Notes (Signed)
  Labor Progress Note   30 y.o. [id number] @ [redacted]w[redacted]d , admitted for  Pregnancy, Labor Management.   Subjective:  Pitocin is at 20 mU/min w mild pain w ctxs  Objective:  BP 109/65 (BP Location: Right Arm)   Pulse 78   Temp 98.4 F (36.9 C) (Oral)   Resp 16   Ht 5\' 9"  (1.753 m)   Wt 99.8 kg   LMP 11/10/2020 (Within Days)   SpO2 100%   BMI 32.49 kg/m  Abd: gravid, ND, FHT present, mild tenderness on exam Extr: trace to 1+ bilateral pedal edema SVE: 5/60/-3 AROM clear Vtx   EFM: FHR: 140 bpm, variability: moderate,  accelerations:  Present,  decelerations:  Absent Toco: Frequency: Every 3-4 minutes Labs: I have reviewed the patient's lab results.   Assessment & Plan:  11/12/2020 @ [redacted]w[redacted]d, admitted for  Pregnancy and Labor/Delivery Management  1. Pain management: none. 2. FWB: FHT category 1.  3. ID: GBS negative 4. Labor management: AROM now Epidural when needed Cont Pitocin  All discussed with patient, see orders  [redacted]w[redacted]d, MD, Annamarie Major Ob/Gyn, [facility] 08/30/2021  1:27 AM

## 2021-08-30 NOTE — Progress Notes (Signed)
Pt called out asking for a pacifier. RN educated patient that adding a pacifier while trying to establish breastfeeding can cause problems with latching the baby. Pt verbalized understanding.

## 2021-08-31 ENCOUNTER — Inpatient Hospital Stay: Payer: Medicaid Other | Admitting: Anesthesiology

## 2021-08-31 ENCOUNTER — Encounter
Admission: RE | Disposition: A | Discharge status: Home / Self Care | Payer: Self-pay | Source: Home / Self Care | Attending: Obstetrics & Gynecology

## 2021-08-31 DIAGNOSIS — Z302 Encounter for sterilization: Secondary | ICD-10-CM

## 2021-08-31 HISTORY — PX: TUBAL LIGATION: SHX77

## 2021-08-31 LAB — CBC
HCT: 30 % — ABNORMAL LOW (ref 36.0–46.0)
Hemoglobin: 9.6 g/dL — ABNORMAL LOW (ref 12.0–15.0)
MCH: 24.8 pg — ABNORMAL LOW (ref 26.0–34.0)
MCHC: 32 g/dL (ref 30.0–36.0)
MCV: 77.5 fL — ABNORMAL LOW (ref 80.0–100.0)
Platelets: 121 10*3/uL — ABNORMAL LOW (ref 150–400)
RBC: 3.87 MIL/uL (ref 3.87–5.11)
RDW: 15.4 % (ref 11.5–15.5)
WBC: 8.5 10*3/uL (ref 4.0–10.5)
nRBC: 0 % (ref 0.0–0.2)

## 2021-08-31 LAB — URINE DRUG SCREEN, QUALITATIVE (ARMC ONLY)
Amphetamines, Ur Screen: NOT DETECTED
Barbiturates, Ur Screen: NOT DETECTED
Benzodiazepine, Ur Scrn: NOT DETECTED
Cannabinoid 50 Ng, Ur ~~LOC~~: NOT DETECTED
Cocaine Metabolite,Ur ~~LOC~~: NOT DETECTED
MDMA (Ecstasy)Ur Screen: NOT DETECTED
Methadone Scn, Ur: NOT DETECTED
Opiate, Ur Screen: NOT DETECTED
Phencyclidine (PCP) Ur S: NOT DETECTED
Tricyclic, Ur Screen: NOT DETECTED

## 2021-08-31 SURGERY — LIGATION, FALLOPIAN TUBE, POSTPARTUM
Anesthesia: Epidural

## 2021-08-31 MED ORDER — OXYCODONE-ACETAMINOPHEN 5-325 MG PO TABS: 1.0000 | ORAL_TABLET | ORAL | 0 refills | Status: DC | PRN

## 2021-08-31 MED ORDER — SOD CITRATE-CITRIC ACID 500-334 MG/5ML PO SOLN
30.0000 mL | Freq: Once | ORAL | Status: AC
  Administered 2021-08-31: 30 mL via ORAL
  Filled 2021-08-31: qty 30

## 2021-08-31 MED ORDER — BUPIVACAINE IN DEXTROSE 0.75-8.25 % IT SOLN
INTRATHECAL | Status: DC | PRN
  Administered 2021-08-31: 1.5 mL via INTRATHECAL

## 2021-08-31 MED ORDER — METOCLOPRAMIDE HCL 5 MG/ML IJ SOLN
INTRAMUSCULAR | Status: AC
  Administered 2021-08-31: 10 mg via INTRAVENOUS
  Filled 2021-08-31: qty 2

## 2021-08-31 MED ORDER — 0.9 % SODIUM CHLORIDE (POUR BTL) OPTIME
TOPICAL | Status: DC | PRN
  Administered 2021-08-31: 500 mL

## 2021-08-31 MED ORDER — OXYCODONE HCL 5 MG PO TABS: 5.0000 mg | ORAL_TABLET | ORAL | Status: DC | PRN

## 2021-08-31 MED ORDER — PHENYLEPHRINE HCL (PRESSORS) 10 MG/ML IV SOLN
INTRAVENOUS | Status: AC
  Filled 2021-08-31: qty 2

## 2021-08-31 MED ORDER — MIDAZOLAM HCL 2 MG/2ML IJ SOLN
INTRAMUSCULAR | Status: AC
  Administered 2021-08-31: 2 mg via INTRAVENOUS
  Filled 2021-08-31: qty 2

## 2021-08-31 MED ORDER — BUPIVACAINE HCL 0.5 % IJ SOLN
INTRAMUSCULAR | Status: DC | PRN
  Administered 2021-08-31: 10 mL

## 2021-08-31 MED ORDER — MIDAZOLAM HCL 2 MG/2ML IJ SOLN: 2.0000 mg | Freq: Once | INTRAMUSCULAR | Status: AC

## 2021-08-31 MED ORDER — METOCLOPRAMIDE HCL 5 MG/ML IJ SOLN
10.0000 mg | Freq: Once | INTRAMUSCULAR | Status: AC
  Filled 2021-08-31: qty 2

## 2021-08-31 MED ORDER — FENTANYL CITRATE (PF) 100 MCG/2ML IJ SOLN
INTRAMUSCULAR | Status: AC
  Filled 2021-08-31: qty 2

## 2021-08-31 MED ORDER — MIDAZOLAM HCL 2 MG/2ML IJ SOLN
INTRAMUSCULAR | Status: AC
  Filled 2021-08-31: qty 2

## 2021-08-31 MED ORDER — DEXMEDETOMIDINE (PRECEDEX) IN NS 20 MCG/5ML (4 MCG/ML) IV SYRINGE
PREFILLED_SYRINGE | INTRAVENOUS | Status: DC | PRN
  Administered 2021-08-31: 12 ug via INTRAVENOUS

## 2021-08-31 MED ORDER — FENTANYL CITRATE (PF) 100 MCG/2ML IJ SOLN
INTRAMUSCULAR | Status: DC | PRN
  Administered 2021-08-31: 25 ug via INTRAVENOUS

## 2021-08-31 MED ORDER — PROPOFOL 10 MG/ML IV BOLUS
INTRAVENOUS | Status: AC
  Filled 2021-08-31: qty 20

## 2021-08-31 SURGICAL SUPPLY — 27 items
CHLORAPREP W/TINT 26 (MISCELLANEOUS) ×2 IMPLANT
DERMABOND ADVANCED (GAUZE/BANDAGES/DRESSINGS) ×1
DERMABOND ADVANCED .7 DNX12 (GAUZE/BANDAGES/DRESSINGS) ×1 IMPLANT
DRAPE LAPAROTOMY 100X77 ABD (DRAPES) ×2 IMPLANT
DRSG TEGADERM 2-3/8X2-3/4 SM (GAUZE/BANDAGES/DRESSINGS) ×2 IMPLANT
DRSG TELFA 4X3 1S NADH ST (GAUZE/BANDAGES/DRESSINGS) ×2 IMPLANT
ELECT CAUTERY BLADE 6.4 (BLADE) ×2 IMPLANT
ELECT REM PT RETURN 9FT ADLT (ELECTROSURGICAL) ×2
ELECTRODE REM PT RTRN 9FT ADLT (ELECTROSURGICAL) ×1 IMPLANT
GAUZE 4X4 16PLY ~~LOC~~+RFID DBL (SPONGE) ×2 IMPLANT
GLOVE SURG ENC MOIS LTX SZ8 (GLOVE) ×2 IMPLANT
GOWN STRL REUS W/ TWL LRG LVL3 (GOWN DISPOSABLE) ×1 IMPLANT
GOWN STRL REUS W/ TWL XL LVL3 (GOWN DISPOSABLE) ×1 IMPLANT
GOWN STRL REUS W/TWL LRG LVL3 (GOWN DISPOSABLE) ×1
GOWN STRL REUS W/TWL XL LVL3 (GOWN DISPOSABLE) ×1
LABEL OR SOLS (LABEL) ×2 IMPLANT
MANIFOLD NEPTUNE II (INSTRUMENTS) ×2 IMPLANT
NEEDLE HYPO 22GX1.5 SAFETY (NEEDLE) ×2 IMPLANT
NS IRRIG 500ML POUR BTL (IV SOLUTION) ×2 IMPLANT
PACK BASIN MINOR ARMC (MISCELLANEOUS) ×2 IMPLANT
SCRUB EXIDINE 4% CHG 4OZ (MISCELLANEOUS) ×2 IMPLANT
STRAP SAFETY 5IN WIDE (MISCELLANEOUS) ×2 IMPLANT
SUT VIC AB 0 SH 27 (SUTURE) ×4 IMPLANT
SUT VIC AB 2-0 UR6 27 (SUTURE) ×2 IMPLANT
SUT VIC AB 4-0 PS2 18 (SUTURE) ×2 IMPLANT
SYR 10ML LL (SYRINGE) ×2 IMPLANT
WATER STERILE IRR 500ML POUR (IV SOLUTION) ×2 IMPLANT

## 2021-08-31 NOTE — Anesthesia Procedure Notes (Signed)
Date/Time: 08/31/2021 8:25 AM Performed by: Stormy Fabian, CRNA Pre-anesthesia Checklist: Patient identified, Emergency Drugs available, Suction available and Patient being monitored Patient Re-evaluated:Patient Re-evaluated prior to induction Oxygen Delivery Method: Nasal cannula Induction Type: IV induction Dental Injury: Teeth and Oropharynx as per pre-operative assessment  Comments: Nasal cannula with etCO2 monitoring

## 2021-08-31 NOTE — Transfer of Care (Signed)
Immediate Anesthesia Transfer of Care Note  Patient: Charlene Smith  Procedure(s) Performed: Procedure(s): POST PARTUM TUBAL LIGATION (N/A)  Patient Location: PACU  Anesthesia Type:General  Level of Consciousness: sedated  Airway & Oxygen Therapy: Patient Spontanous Breathing and Patient connected to face mask oxygen  Post-op Assessment: Report given to RN and Post -op Vital signs reviewed and stable  Post vital signs: Reviewed and stable  Last Vitals:  Vitals:   08/31/21 0730 08/31/21 0908  BP: 107/82 107/61  Pulse: 70 79  Resp: 18 17  Temp: 36.9 C 36.7 C  SpO2: 100% 98%    Complications: No apparent anesthesia complications

## 2021-08-31 NOTE — Progress Notes (Signed)
Pt seen, surgery plans discussed. Desires to proceed w permanent sterilization.  The patient has been fully informed about all methods of contraception, both temporary and permanent. She understands that tubal ligation is meant to be permanent, absolute and irreversible. She was told that there is an approximately 1 in 400 chance of a pregnancy in the future after tubal ligation. She was told the short and long term complications of tubal ligation. She understands the risks from this surgery include, but are not limited to, the risks of anesthesia, hemorrhage, infection, perforation, and injury to adjacent structures, bowel, bladder and blood vessels.   Annamarie Major, MD, [facility], [facility] 08/31/2021  7:59 AM
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
## 2021-08-31 NOTE — Anesthesia Procedure Notes (Signed)
Spinal  Patient location during procedure: OR Start time: 08/31/2021 8:28 AM End time: 08/31/2021 8:29 AM Reason for block: surgical anesthesia Staffing Performed: resident/CRNA  Anesthesiologist: Scarlett Presto, MD Resident/CRNA: Doreen Salvage, CRNA Preanesthetic Checklist Completed: patient identified, IV checked, site marked, risks and benefits discussed, surgical consent, monitors and equipment checked, pre-op evaluation and timeout performed Spinal Block Patient position: sitting Prep: ChloraPrep Patient monitoring: heart rate, continuous pulse ox, blood pressure and cardiac monitor Approach: midline Location: L3-4 Injection technique: single-shot Needle Needle type: Introducer and Pencan  Needle gauge: 24 G Needle length: 9 cm Assessment Sensory level: T10 Events: CSF return Additional Notes Negative paresthesia. Negative blood return. Positive free-flowing CSF. Expiration date of kit checked and confirmed. Patient tolerated procedure well, without complications.

## 2021-08-31 NOTE — Op Note (Signed)
Operative Note  08/31/2021  PRE-OP DIAGNOSIS: Desire for sterility  POST-OP DIAGNOSIS: same  SURGEON: Annamarie Major, MD, FACOG  PROCEDURE: Postpartum Bilateral Tubal Ligation Procedure   ANESTHESIA: Choice   ESTIMATED BLOOD LOSS: Min  SPECIMENS: Portion of left and right tube  COMPLICATIONS: none  DISPOSITION: PACU - hemodynamically stable.  CONDITION: stable  FINDINGS: Exam under anesthesia revealed small, mobile uterus with no masses and bilateral adnexa without masses or fullness.   TECHNIQUE:  Patient is prepped and draped in usual sterile fashion after adequate anesthesia is obtained in the supine position on the operating room table.  Local anesthesia is injected into the skin just inferior to the umbilicus, followed by a small elliptical incision with a scapel.  Fascia is identified and tented upwards, and an incision is made with Mayo scissors.  Identification of no adherent bowel is made. Retractors are placed and trendelenburg positioning is achieved.    The left Fallopian tube was identified, grasped with the Babcock clamps, lifted to the skin incision and followed out distally to the fimbriae. An avascular midsection of the tube approximately 3-4cm from the cornua was grasped with the babcock clamps and brought into a knuckle at the skin incision. The tube was double ligated with 2-0 Vicryl suture and the intervening portion of tube was transected and removed. Excellent hemostasis was noted and the tube was returned to the abdomen. Attention was then turned to the right fallopian tube after confirmation of identification by tracing the tube out to the fimbriae. The same procedure was then performed on the right Fallopian tube. Again, excellent hemostasis was noted at the end of the procedure.  Retractors are removed and fascia closed with a 2-0 Vicryl suture. Irrigation and hemostasis confirmed.  Skin closed with a 4-0 vicryl suture in a subcuticular fashion followed by skin  adhesive.  Pt goes to recovery room in stable fashion.  All counts correct times 2.   Annamarie Major, MD, [facility], [facility] 08/31/2021  9:06 AM

## 2021-08-31 NOTE — Discharge Instructions (Signed)

## 2021-08-31 NOTE — Progress Notes (Signed)
Patient discharged home with family.  Discharge instructions, when to follow up, and prescriptions reviewed with patient.  Patient verbalized understanding. Patient will be escorted out by auxiliary.   

## 2021-08-31 NOTE — Interval H&P Note (Signed)
History and Physical Interval Note:  08/31/2021 7:58 AM  Charlene Smith  has presented today for surgery after delivery yesterday, with the diagnosis of sterilization.  The various methods of treatment have been discussed with the patient and family. After consideration of risks, benefits and other options for treatment, the patient has consented to  Procedure(s): POST PARTUM TUBAL LIGATION (N/A) as a surgical intervention.  The patient's history has been reviewed, patient examined, no change in status, stable for surgery.  I have reviewed the patient's chart and labs.  Questions were answered to the patient's satisfaction.     Letitia Libra

## 2021-08-31 NOTE — Anesthesia Postprocedure Evaluation (Signed)
Anesthesia Post Note  Patient: Charlene Smith  Procedure(s) Performed: POST PARTUM TUBAL LIGATION  Patient location during evaluation: PACU Anesthesia Type: Spinal Level of consciousness: awake, oriented and awake and alert Pain management: pain level controlled Vital Signs Assessment: post-procedure vital signs reviewed and stable Respiratory status: spontaneous breathing and respiratory function stable Cardiovascular status: blood pressure returned to baseline Postop Assessment: no headache, no backache and spinal receding Anesthetic complications: no   No notable events documented.   Last Vitals:  Vitals:   08/31/21 0730 08/31/21 0908  BP: 107/82 107/61  Pulse: 70 79  Resp: 18 17  Temp: 36.9 C 36.7 C  SpO2: 100% 98%    Last Pain:  Vitals:   08/31/21 0908  TempSrc:   PainSc: Asleep                 Zaynab Chipman

## 2021-09-02 ENCOUNTER — Telehealth: Payer: Self-pay

## 2021-09-02 ENCOUNTER — Encounter: Payer: Self-pay | Admitting: Obstetrics & Gynecology

## 2021-09-02 NOTE — Telephone Encounter (Signed)
Transition Care Management Unsuccessful Follow-up Telephone Call  Date of discharge and from where:  08/31/2021-[facility]  Attempts:  1st Attempt  Reason for unsuccessful TCM follow-up call:  Left voice message

## 2021-09-03 LAB — SURGICAL PATHOLOGY

## 2021-09-03 NOTE — Telephone Encounter (Signed)
Transition Care Management Unsuccessful Follow-up Telephone Call  Date of discharge and from where:  08/31/2021 from [facility]  Attempts:  2nd Attempt  Reason for unsuccessful TCM follow-up call:  Left voice message

## 2021-09-04 NOTE — Telephone Encounter (Signed)
Transition Care Management Unsuccessful Follow-up Telephone Call  Date of discharge and from where:  08/31/2021 from [facility]  Attempts:  3rd Attempt  Reason for unsuccessful TCM follow-up call:  Unable to reach patient

## 2021-09-06 NOTE — Anesthesia Postprocedure Evaluation (Addendum)
Anesthesia Post Note  Patient: Charlene Smith  Procedure(s) Performed: AN AD HOC LABOR EPIDURAL  Anesthesia Type: Epidural Vital Signs Assessment: post-procedure vital signs reviewed and stable Respiratory status: respiratory function stable Cardiovascular status: blood pressure returned to baseline and stable Postop Assessment: able to ambulate Anesthetic complications: no Comments: Patient was discharged prior to being seen by the anesthesia team.  No complaints and did well per nursing notes.   No notable events documented.   Last Vitals: There were no vitals filed for this visit.  Last Pain: There were no vitals filed for this visit.               Lenard Simmer

## 2021-10-09 ENCOUNTER — Encounter: Payer: Self-pay | Admitting: Obstetrics & Gynecology

## 2021-10-25 ENCOUNTER — Ambulatory Visit (INDEPENDENT_AMBULATORY_CARE_PROVIDER_SITE_OTHER): Payer: Medicaid Other | Admitting: Obstetrics & Gynecology

## 2021-10-25 ENCOUNTER — Encounter: Payer: Self-pay | Admitting: Obstetrics & Gynecology

## 2021-10-25 ENCOUNTER — Other Ambulatory Visit: Payer: Self-pay

## 2021-10-25 NOTE — Progress Notes (Signed)
°  OBSTETRICS POSTPARTUM CLINIC PROGRESS NOTE  Subjective:     Charlene Smith is a 30 y.o. [phone] female who presents for a postpartum visit. She is 6 weeks postpartum following a Term pregnancy, Single fetus, or Uncomplicated pregnancy and delivery by Vaginal, no problems at delivery.  Also, she has had PP BTL. I have fully reviewed the prenatal and intrapartum course. Anesthesia: epidural.  Postpartum course has been complicated by uncomplicated.  Baby is feeding by Bottle.  Bleeding: patient has not  resumed menses.  Bowel function is normal. Bladder function is normal.  Patient is not sexually active. Contraception method desired is tubal ligation.  Postpartum depression screening: negative. Edinburgh 6.  The following portions of the patient's history were reviewed and updated as appropriate: allergies, current medications, past family history, past medical history, past social history, past surgical history, and problem list.  Review of Systems Pertinent items are noted in HPI.  Objective:    BP 120/80    Ht 5\' 9"  (1.753 m)    Wt 214 lb (97.1 kg)    Breastfeeding No    BMI 31.60 kg/m   General:  alert and no distress   Breasts:  inspection negative, no nipple discharge or bleeding, no masses or nodularity palpable  Lungs: clear to auscultation bilaterally  Heart:  regular rate and rhythm, S1, S2 normal, no murmur, click, rub or gallop  Abdomen: soft, non-tender; bowel sounds normal; no masses,  no organomegaly.  Well healed TUBAL incision   Vulva:  normal  Vagina: normal vagina, no discharge, exudate, lesion, or erythema  Cervix:  no cervical motion tenderness and no lesions  Corpus: normal size, contour, position, consistency, mobility, non-tender  Adnexa:  normal adnexa and no mass, fullness, tenderness  Rectal Exam: Not performed.          Assessment:  Post Partum Care visit 1. Postpartum care and examination  Plan:  See orders and Patient  Instructions Resume all normal activities Follow up in: 4 months for PAP, or as needed.   , MD, Annamarie Major Ob/Gyn, [facility] 10/25/2021  3:12 PM

## 2022-04-24 ENCOUNTER — Ambulatory Visit (INDEPENDENT_AMBULATORY_CARE_PROVIDER_SITE_OTHER): Payer: Medicaid Other | Admitting: Obstetrics and Gynecology

## 2022-04-24 ENCOUNTER — Encounter: Payer: Self-pay | Admitting: Obstetrics and Gynecology

## 2022-04-24 VITALS — BP 120/70 | Ht 67.0 in | Wt 223.0 lb

## 2022-04-24 DIAGNOSIS — B3731 Acute candidiasis of vulva and vagina: Secondary | ICD-10-CM | POA: Diagnosis not present

## 2022-04-24 LAB — POCT WET PREP WITH KOH
Clue Cells Wet Prep HPF POC: NEGATIVE
KOH Prep POC: NEGATIVE
Trichomonas, UA: NEGATIVE
Yeast Wet Prep HPF POC: POSITIVE

## 2022-04-24 MED ORDER — FLUCONAZOLE 150 MG PO TABS: 150.0000 mg | ORAL_TABLET | Freq: Once | ORAL | 0 refills | Status: AC
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
## 2022-05-02 IMAGING — US US OB COMP +14 WK
1 of 2 series · 13 of 28 positions shown · non-contrast
Comparison: none

CLINICAL DATA: Anatomy evaluation

EXAM:
OBSTETRICAL ULTRASOUND >14 WKS

[Series 1: us ob comp + 14 wk · 74 acquisitions, 13 frames shown]
[im 3/74]
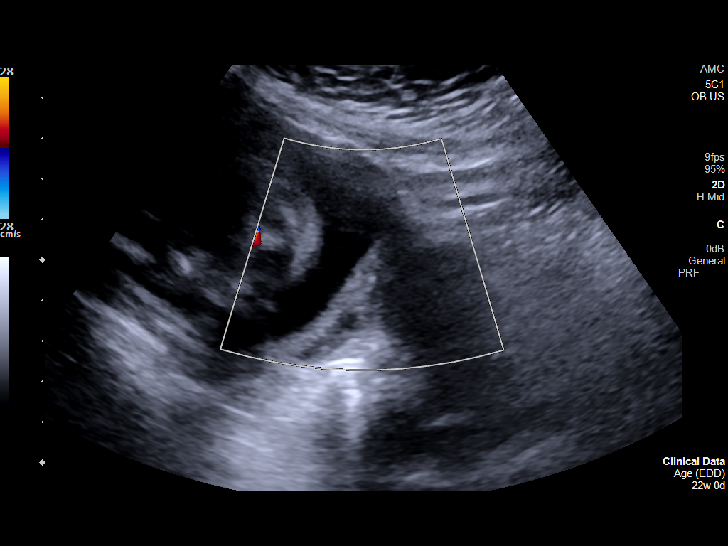
[im 9/74]
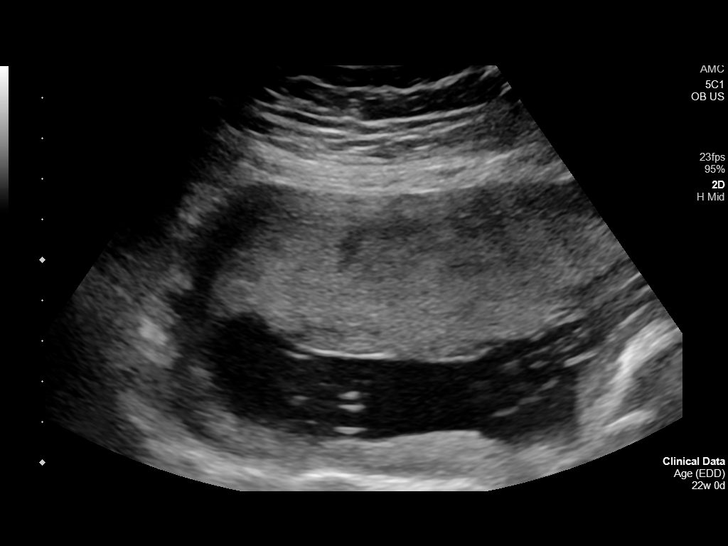
[im 15/74]
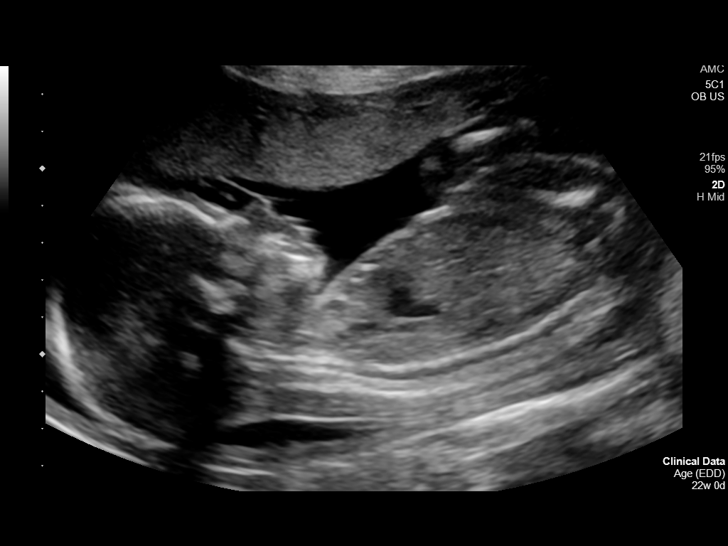
[im 20/74]
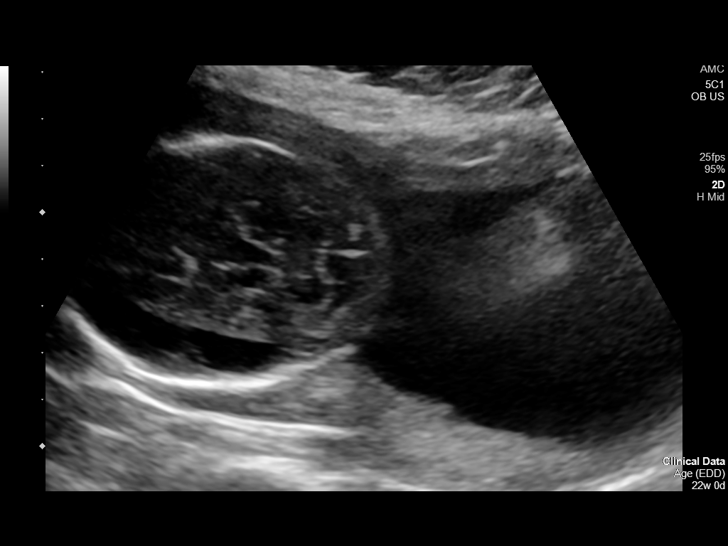
[im 26/74]
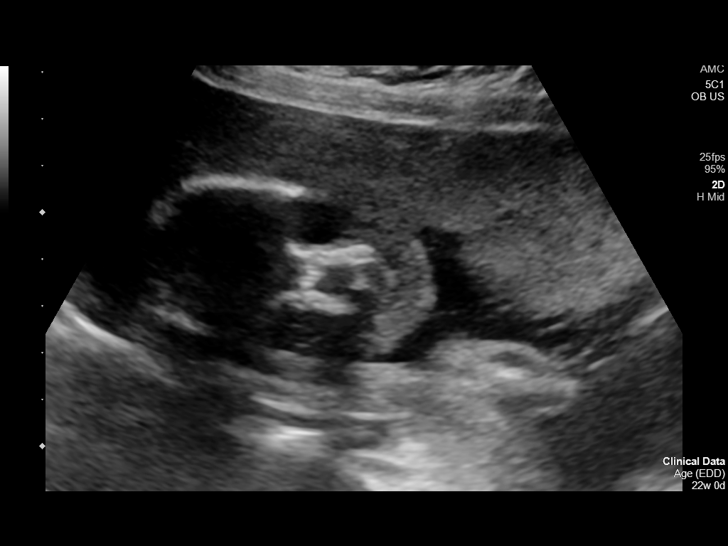
[im 31/74]
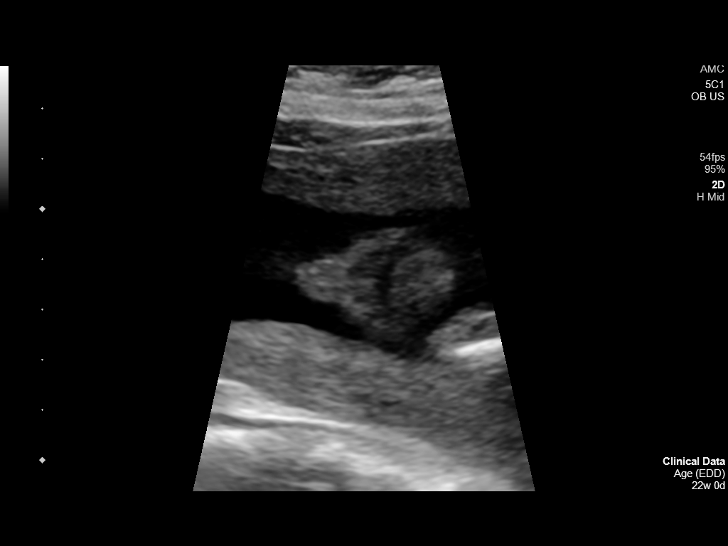
[im 40/74]
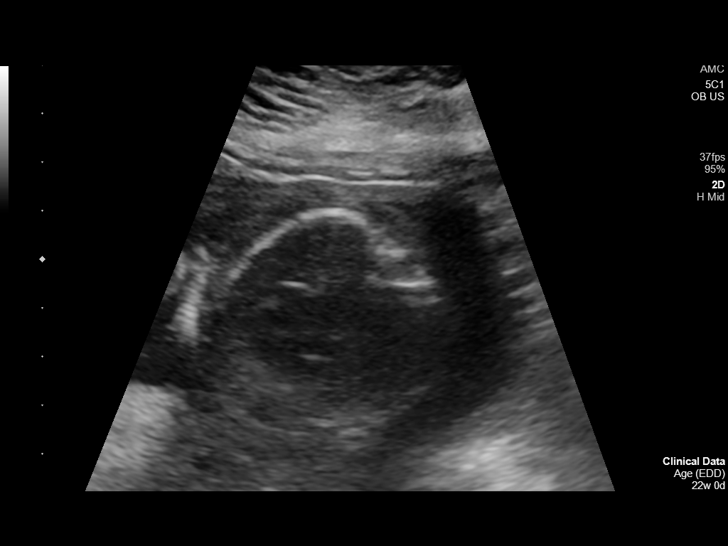
[im 45/74]
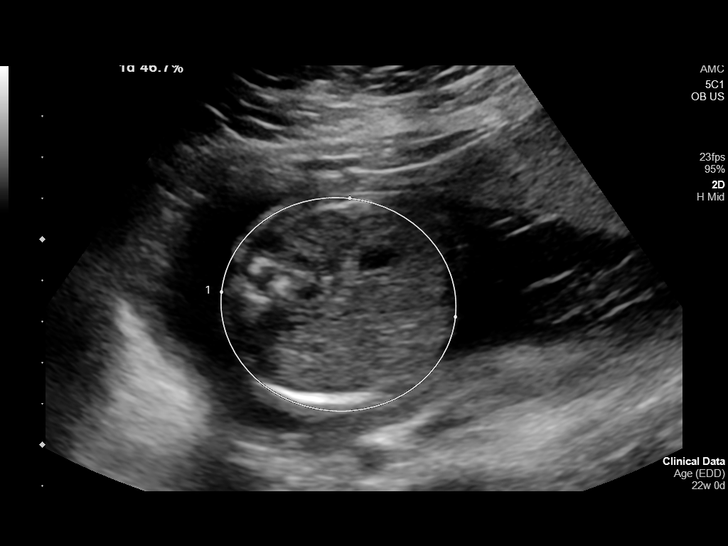
[im 51/74]
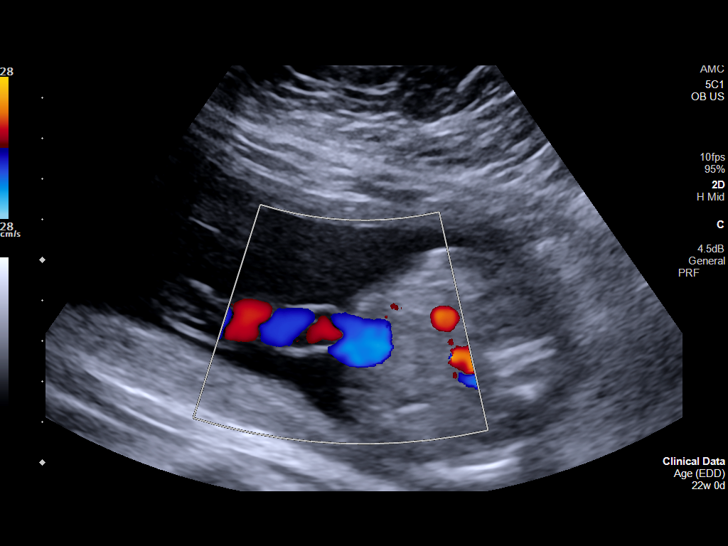
[im 57/74]
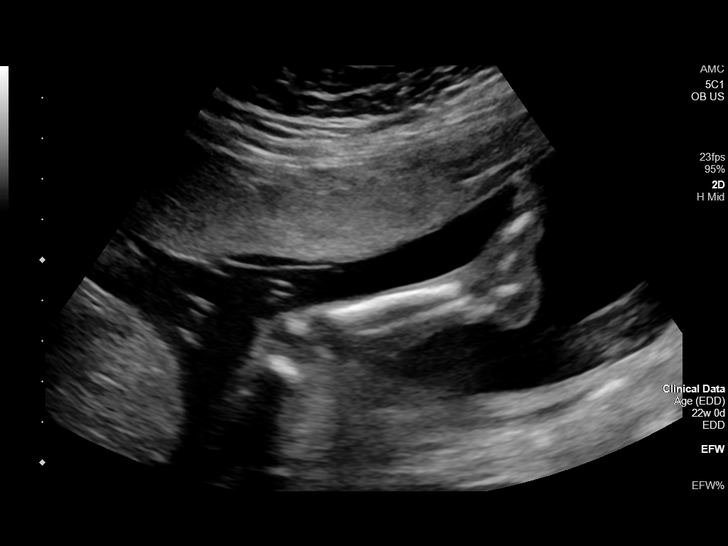
[im 62/74]
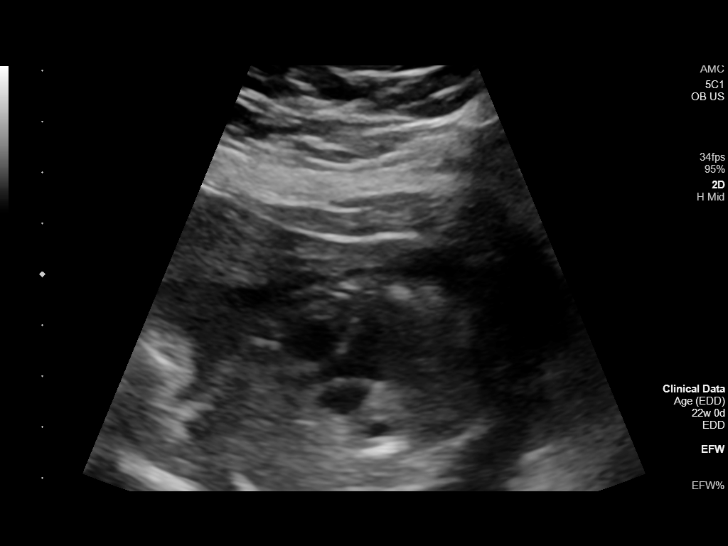
[im 68/74]
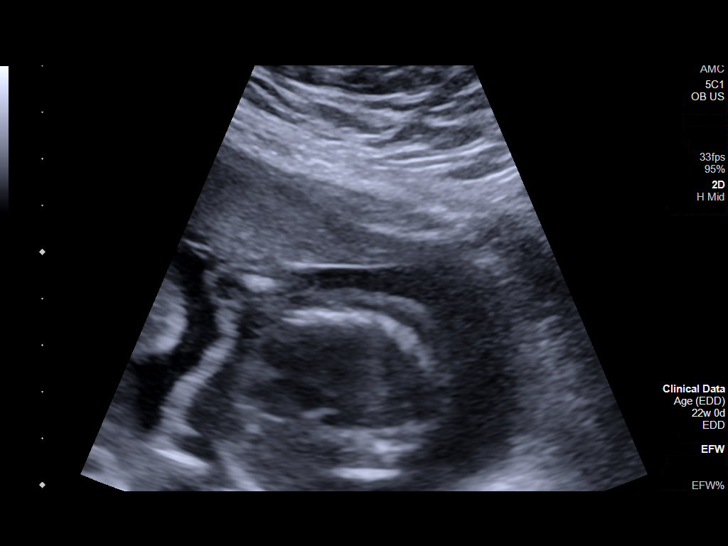
[im 74/74]
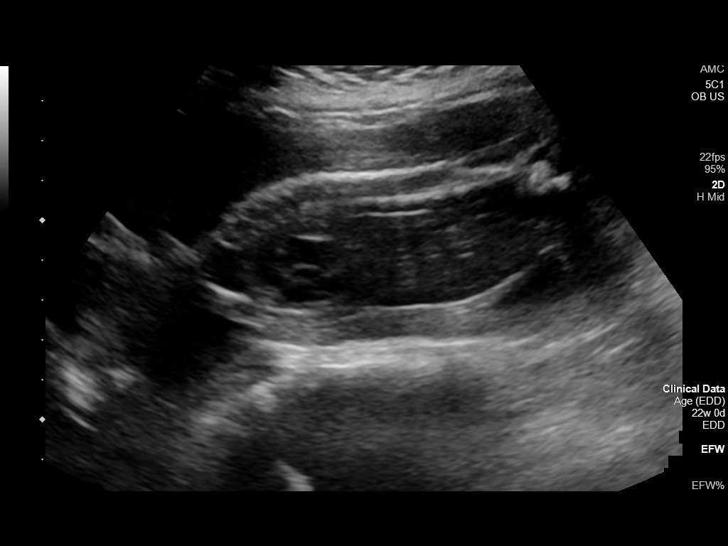

[13 of 28 positions shown; findings below may reference images not displayed]

FINDINGS: Number of Fetuses: 1

Heart Rate:  123 bpm

Movement: Yes

Presentation: Breech

Previa: No

Placental Location: Anterior

Amniotic Fluid (Subjective): Normal

Amniotic Fluid (Objective):

Vertical pocket = 3.7cm

FETAL BIOMETRY

BPD: 5.2cm 21w 5d

HC: 19.4cm  21w   5d

AC: 17.2cm  22w   1d

FL:   4cm  22w   5d

Current Mean GA: 22w 1d                     US EDC: 06/04/2021

Assigned GA:  22w 0d            Assigned EDC: 06/05/2021

FETAL ANATOMY

Lateral Ventricles: Appears normal

Thalami/CSP: Appears normal

Posterior Fossa:  Appears normal

Nuchal Region: Appears normal   NFT= N/A > 20 WKS

Upper Lip: Appears normal

Spine: Appears normal

4 Chamber Heart on Left: Appears normal

LVOT: Appears normal

RVOT: Appears normal

Stomach on Left: Appears normal

3 Vessel Cord: Appears normal

Cord Insertion site: Appears normal

Kidneys: Appears normal

Bladder: Appears normal

Extremities: Appears normal

Technically difficult due to: None

Maternal Findings:

Cervix:  Cervical length 3.9 cm
IMPRESSION: Single live intrauterine pregnancy as detailed above.

## 2022-07-12 ENCOUNTER — Emergency Department: Payer: Medicaid Other

## 2022-07-12 ENCOUNTER — Emergency Department
Admission: EM | Admit: 2022-07-12 | Discharge: 2022-07-12 | Disposition: A | Discharge status: Home / Self Care | Payer: Medicaid Other | Attending: Emergency Medicine | Admitting: Emergency Medicine

## 2022-07-12 DIAGNOSIS — R1013 Epigastric pain: Secondary | ICD-10-CM | POA: Diagnosis not present

## 2022-07-12 DIAGNOSIS — R079 Chest pain, unspecified: Secondary | ICD-10-CM | POA: Diagnosis not present

## 2022-07-12 LAB — CBC WITH DIFFERENTIAL/PLATELET
Abs Immature Granulocytes: 0.02 10*3/uL (ref 0.00–0.07)
Basophils Absolute: 0 10*3/uL (ref 0.0–0.1)
Basophils Relative: 0 %
Eosinophils Absolute: 0.1 10*3/uL (ref 0.0–0.5)
Eosinophils Relative: 1 %
HCT: 39.5 % (ref 36.0–46.0)
Hemoglobin: 12.6 g/dL (ref 12.0–15.0)
Immature Granulocytes: 0 %
Lymphocytes Relative: 35 %
Lymphs Abs: 2.2 10*3/uL (ref 0.7–4.0)
MCH: 26.2 pg (ref 26.0–34.0)
MCHC: 31.9 g/dL (ref 30.0–36.0)
MCV: 82.1 fL (ref 80.0–100.0)
Monocytes Absolute: 0.5 10*3/uL (ref 0.1–1.0)
Monocytes Relative: 7 %
Neutro Abs: 3.5 10*3/uL (ref 1.7–7.7)
Neutrophils Relative %: 57 %
Platelets: 160 10*3/uL (ref 150–400)
RBC: 4.81 MIL/uL (ref 3.87–5.11)
RDW: 14.3 % (ref 11.5–15.5)
WBC: 6.3 10*3/uL (ref 4.0–10.5)
nRBC: 0 % (ref 0.0–0.2)

## 2022-07-12 LAB — COMPREHENSIVE METABOLIC PANEL
ALT: 20 U/L (ref 0–44)
AST: 23 U/L (ref 15–41)
Albumin: 4.3 g/dL (ref 3.5–5.0)
Alkaline Phosphatase: 92 U/L (ref 38–126)
Anion gap: 8 (ref 5–15)
BUN: 14 mg/dL (ref 6–20)
CO2: 24 mmol/L (ref 22–32)
Calcium: 9 mg/dL (ref 8.9–10.3)
Chloride: 106 mmol/L (ref 98–111)
Creatinine, Ser: 0.72 mg/dL (ref 0.44–1.00)
GFR, Estimated: >60 mL/min (ref >60)
Glucose, Bld: 99 mg/dL (ref 70–99)
Potassium: 3.6 mmol/L (ref 3.5–5.1)
Sodium: 138 mmol/L (ref 135–145)
Total Bilirubin: 0.4 mg/dL (ref 0.3–1.2)
Total Protein: 7.4 g/dL (ref 6.5–8.1)

## 2022-07-12 LAB — TROPONIN I (HIGH SENSITIVITY)
Troponin I (High Sensitivity): 2 ng/L (ref <18)
Troponin I (High Sensitivity): 7 ng/L (ref <18)

## 2022-07-12 LAB — HCG, QUANTITATIVE, PREGNANCY: hCG, Beta Chain, Quant, S: <1 m[IU]/mL (ref <5)

## 2022-07-12 LAB — LIPASE, BLOOD: Lipase: 40 U/L (ref 11–51)

## 2022-07-12 MED ORDER — LIDOCAINE VISCOUS HCL 2 % MT SOLN
15.0000 mL | Freq: Once | OROMUCOSAL | Status: AC
  Administered 2022-07-12: 15 mL via ORAL
  Filled 2022-07-12: qty 15

## 2022-07-12 MED ORDER — ALUM & MAG HYDROXIDE-SIMETH 200-200-20 MG/5ML PO SUSP
30.0000 mL | Freq: Once | ORAL | Status: AC
  Administered 2022-07-12: 30 mL via ORAL
  Filled 2022-07-12: qty 30

## 2022-07-12 MED ORDER — PANTOPRAZOLE SODIUM 40 MG PO TBEC
40.0000 mg | DELAYED_RELEASE_TABLET | Freq: Once | ORAL | Status: AC
  Administered 2022-07-12: 40 mg via ORAL
  Filled 2022-07-12: qty 1

## 2022-07-12 MED ORDER — PANTOPRAZOLE SODIUM 40 MG PO TBEC: 40.0000 mg | DELAYED_RELEASE_TABLET | Freq: Every day | ORAL | 1 refills | Status: DC

## 2022-07-12 MED ORDER — IOHEXOL 300 MG/ML  SOLN
100.0000 mL | Freq: Once | INTRAMUSCULAR | Status: AC | PRN
  Administered 2022-07-12: 100 mL via INTRAVENOUS

## 2022-07-12 NOTE — Discharge Instructions (Signed)
The CT and lab work that we did showed only some gallstones.  Gallstones do not usually cause pain where you are having the pain.  Gallstone pain is usually on the right side.  I am going to give you some Protonix.  This may help with the pain that you are having.  I would not take aspirin or Motrin or Naprosyn at this time as if the pain that you are having is gastritis or the beginning of an ulcer these medicines will make it worse.  You can use Tylenol if you need to and please return if you have much worse pain or any fever or vomiting.  As I mentioned to you here in the emergency room we are open 24 hours a day if the pain gets really bad at 3:00 in the morning we would be happy to see you then too.

## 2022-07-12 NOTE — ED Provider Notes (Signed)
[facility] Provider Note    Event Date/Time   First MD Initiated Contact with Patient 07/12/22 [phone]     (approximate)   History   Chest Pain   HPI  Charlene Smith is a 31 y.o. female who woke up in the middle of the night last night with low chest pain radiating through to the back little bit of epigastric pain 2.  She took some aspirin seem to get worse.  She came in the hospital this morning and we gave her GI cocktail the pain really did not change much possibly got a little bit better in the chest still present in the epigastric area now worse in the back feels like back labor she says.  She said she has had 3 children and each one of them she had back labor with and this is exactly what it feels like.  Kind of crampy and fairly severe.  Pain is not worse with deep breathing patient is not short of breath does not have a fever is not nauseated or vomiting      Physical Exam   Triage Vital Signs: ED Triage Vitals  Enc Vitals Group     BP 07/12/22 0640 137/78     Pulse Rate 07/12/22 0640 62     Resp 07/12/22 0640 18     Temp 07/12/22 0640 97.8 F (36.6 C)     Temp Source 07/12/22 0640 Oral     SpO2 07/12/22 0640 100 %     Weight 07/12/22 0634 220 lb (99.8 kg)     Height 07/12/22 0634 5\' 8"  (1.727 m)     Head Circumference --      Peak Flow --      Pain Score 07/12/22 0634 7     Pain Loc --      Pain Edu? --      Excl. in GC? --     Most recent vital signs: Vitals:   07/12/22 0900 07/12/22 1216  BP: 122/83 131/68  Pulse: 65 80  Resp: 18 18  Temp:  98.7 F (37.1 C)  SpO2: 100% 100%     General: Awake, looks uncomfortable CV:  Good peripheral perfusion.  Resp:  Normal effort.  Abd:  No distention.  Soft nontender except for mildly so in the epigastric area Back no tenderness palpation or percussion Extremities no edema   ED Results / Procedures / Treatments   Labs (all labs ordered are listed, but only abnormal results  are displayed) Labs Reviewed  COMPREHENSIVE METABOLIC PANEL  LIPASE, BLOOD  CBC WITH DIFFERENTIAL/PLATELET  HCG, QUANTITATIVE, PREGNANCY  TROPONIN I (HIGH SENSITIVITY)  TROPONIN I (HIGH SENSITIVITY)     EKG  EKG read and interpreted by me shows normal sinus rhythm rate of 67 slight right axis computer is measuring 91 degrees no acute ST-T wave changes   RADIOLOGY Chest x-ray read interpreted by me read and confirmed by radiology shows no acute disease   PROCEDURES:  Critical Care performed:   Procedures   MEDICATIONS ORDERED IN ED: Medications  alum & mag hydroxide-simeth (MAALOX/MYLANTA) 200-200-20 MG/5ML suspension 30 mL (30 mLs Oral Given 07/12/22 0733)    And  lidocaine (XYLOCAINE) 2 % viscous mouth solution 15 mL (15 mLs Oral Given 07/12/22 0733)  iohexol (OMNIPAQUE) 300 MG/ML solution 100 mL (100 mLs Intravenous Contrast Given 07/12/22 0953)  pantoprazole (PROTONIX) EC tablet 40 mg (40 mg Oral Given 07/12/22 1221)     IMPRESSION / MDM /  ASSESSMENT AND PLAN / ED COURSE  I reviewed the triage vital signs and the nursing notes. Patient with some epigastric tenderness but no chest wall tenderness.  No tenderness on deep breathing.  Pain radiates to the back.  There is no tenderness in the right or left upper quadrants even to deep palpation.  Chest CT and abdomen pelvis CT do not show any acute pathology except for some gallstones.  Patient has normal labs and again is not tender at all even to deep palpation in the right upper quadrant.  The pain changed and moved more to the back when I gave her GI cocktail.  I am thinking that possibly this is due to developing peptic ulcer or gastritis.  I will treat her with a PPI and have her follow-up with her doctor.  I have advised her to please return if the pain worsens or she has any other symptoms like fever vomiting etc.  Differential diagnosis includes, but is not limited to, pancreatitis would be in the differential but there is  no elevation of her lipase or nothing on the CT.  There is no sign of any renal stones.  Her gallbladder is not tender and her labs are normal.  There is no sign of any intestinal obstruction either.  There is no pain in her back as it would be with discitis.  There is no numbness or weakness in the legs.  The pain does not radiate except for from the front to the back.  Think the most likely thing would be something with her stomach like a ulcer or gastritis.  Patient's presentation is most consistent with acute complicated illness / injury requiring diagnostic workup.  The patient is on the cardiac monitor to evaluate for evidence of arrhythmia and/or significant heart rate changes.  None have been seen.      FINAL CLINICAL IMPRESSION(S) / ED DIAGNOSES   Final diagnoses:  Abdominal pain, epigastric     Rx / DC Orders   ED Discharge Orders          Ordered    pantoprazole (PROTONIX) 40 MG tablet  Daily        07/12/22 1204             Note:  This document was prepared using Dragon voice recognition software and may include unintentional dictation errors.   Nena Polio, MD 07/12/22 [phone]

## 2022-07-12 NOTE — ED Notes (Signed)
Pt brought to room 6 for c/o chest pain.  This RN assuming care.

## 2022-07-12 NOTE — ED Notes (Signed)
X-ray at bedside

## 2022-07-12 NOTE — ED Notes (Signed)
Pt states that her chest pain has much improved but her back is still hurting- pt given a small amount of ice water

## 2022-07-12 NOTE — ED Triage Notes (Signed)
Pt presents to ER with c/o chest pain that started last might prior to going to bed.  Pt states when she woke up this morning, the chest pain had become worse and states pain radiates straight through her back.  Pt denies sob, n/v or dizziness.  Pt is A&O x4 at this time in NAD on arrival.

## 2022-07-12 NOTE — ED Notes (Signed)
Pt A&O, IV removed, pt given discharge instructions, pt ambulating with steady gait. 

## 2022-10-07 ENCOUNTER — Ambulatory Visit: Payer: Medicaid Other

## 2022-10-07 NOTE — Patient Instructions (Incomplete)

## 2022-10-07 NOTE — Progress Notes (Deleted)
    NURSE VISIT NOTE  Subjective:    Patient ID: Charlene Smith, female    DOB: [birth year], 31 y.o.   MRN: [id number]  HPI  Patient is a 32 y.o. [phone] female who presents for {pe vag discharge desc:315065} vaginal discharge for *** {gen duration:315003}. Denies abnormal vaginal bleeding or significant pelvic pain or fever. {Actions; denies/reports/admits to:19208} {UTI Symptoms:210800002}. Patient {has/denies:315300} history of known exposure to STD.   Objective:    There were no vitals taken for this visit.   @THIS  VISIT ONLY@  Assessment:   No diagnosis found.  {vaginitis type:315262}  Plan:   GC and chlamydia DNA  probe sent to lab. Treatment: {vaginitis tx:315263} ROV prn if symptoms persist or worsen.   , LPN

## 2023-08-03 ENCOUNTER — Encounter: Payer: Self-pay | Admitting: Licensed Practical Nurse

## 2023-08-03 ENCOUNTER — Ambulatory Visit (INDEPENDENT_AMBULATORY_CARE_PROVIDER_SITE_OTHER): Payer: Medicaid Other | Admitting: Licensed Practical Nurse

## 2023-08-03 ENCOUNTER — Other Ambulatory Visit (HOSPITAL_COMMUNITY)
Admission: RE | Admit: 2023-08-03 | Discharge: 2023-08-03 | Disposition: A | Discharge status: Home / Self Care | Payer: Medicaid Other | Source: Ambulatory Visit | Attending: Licensed Practical Nurse | Admitting: Licensed Practical Nurse

## 2023-08-03 VITALS — BP 116/75 | HR 92 | Ht 68.0 in | Wt 225.4 lb

## 2023-08-03 DIAGNOSIS — R0600 Dyspnea, unspecified: Secondary | ICD-10-CM

## 2023-08-03 DIAGNOSIS — R5383 Other fatigue: Secondary | ICD-10-CM

## 2023-08-03 DIAGNOSIS — Z113 Encounter for screening for infections with a predominantly sexual mode of transmission: Secondary | ICD-10-CM | POA: Diagnosis present

## 2023-08-03 DIAGNOSIS — Z131 Encounter for screening for diabetes mellitus: Secondary | ICD-10-CM

## 2023-08-03 DIAGNOSIS — Z01419 Encounter for gynecological examination (general) (routine) without abnormal findings: Secondary | ICD-10-CM

## 2023-08-03 DIAGNOSIS — N9089 Other specified noninflammatory disorders of vulva and perineum: Secondary | ICD-10-CM
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
## 2023-08-04 LAB — HSV 1 AND 2 AB, IGG
HSV 1 Glycoprotein G Ab, IgG: NONREACTIVE
HSV 2 IgG, Type Spec: NONREACTIVE

## 2023-08-04 LAB — CERVICOVAGINAL ANCILLARY ONLY
Bacterial Vaginitis (gardnerella): NEGATIVE
Candida Glabrata: NEGATIVE
Candida Vaginitis: NEGATIVE
Chlamydia: NEGATIVE
Comment: NEGATIVE
Comment: NEGATIVE
Comment: NEGATIVE
Comment: NEGATIVE
Comment: NEGATIVE
Comment: NORMAL
Neisseria Gonorrhea: NEGATIVE
Trichomonas: NEGATIVE

## 2023-08-04 LAB — HEMOGLOBIN A1C
Est. average glucose Bld gHb Est-mCnc: 105 mg/dL
Hgb A1c MFr Bld: 5.3 % (ref 4.8–5.6)

## 2023-08-04 LAB — HEP, RPR, HIV PANEL
HIV Screen 4th Generation wRfx: NONREACTIVE
Hepatitis B Surface Ag: NEGATIVE
RPR Ser Ql: NONREACTIVE

## 2023-08-04 LAB — CBC
Hematocrit: 41 % (ref 34.0–46.6)
Hemoglobin: 13 g/dL (ref 11.1–15.9)
MCH: 26.1 pg — ABNORMAL LOW (ref 26.6–33.0)
MCHC: 31.7 g/dL (ref 31.5–35.7)
MCV: 82 fL (ref 79–97)
Platelets: 171 x10E3/uL (ref 150–450)
RBC: 4.98 x10E6/uL (ref 3.77–5.28)
RDW: 13.7 % (ref 11.7–15.4)
WBC: 5.2 x10E3/uL (ref 3.4–10.8)

## 2023-08-04 LAB — TSH+FREE T4
Free T4: 1.17 ng/dL (ref 0.82–1.77)
TSH: 0.786 u[IU]/mL (ref 0.450–4.500)

## 2023-08-06 ENCOUNTER — Telehealth: Payer: Self-pay

## 2023-08-06 NOTE — Telephone Encounter (Signed)
Pt calling; was seen Monday; had a bump inside vagina; it popped Mon pm and then filled back up again and popped again this morning; is starting to hurt again.  [phone]  Pt states she has been doing the warm compresses.  Adv to monitor over the weekend and keep doing the warm compresses; if it is no better or gets worse to call to be seen next week.  Pt agrees.

## 2023-09-16 ENCOUNTER — Ambulatory Visit (INDEPENDENT_AMBULATORY_CARE_PROVIDER_SITE_OTHER): Payer: Medicaid Other | Admitting: Physician Assistant

## 2023-09-16 ENCOUNTER — Encounter: Payer: Self-pay | Admitting: Physician Assistant

## 2023-09-16 VITALS — BP 106/80 | HR 80 | Temp 97.9°F | Ht 68.0 in | Wt 224.0 lb

## 2023-09-16 DIAGNOSIS — E66811 Obesity, class 1: Secondary | ICD-10-CM | POA: Diagnosis not present

## 2023-09-16 DIAGNOSIS — Z7689 Persons encountering health services in other specified circumstances: Secondary | ICD-10-CM | POA: Diagnosis not present

## 2023-09-16 DIAGNOSIS — Z6834 Body mass index (BMI) 34.0-34.9, adult: Secondary | ICD-10-CM | POA: Diagnosis not present

## 2023-09-16 NOTE — Patient Instructions (Signed)
-
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
